# Patient Record
Sex: Male | Born: 1965 | Race: Black or African American | Hispanic: No | Marital: Married | Smoking: Never smoker
Health system: Southern US, Community
[De-identification: ages and names within clinical notes are randomized; demographics above are authoritative.]

## PROBLEM LIST (undated history)

## (undated) DIAGNOSIS — M722 Plantar fascial fibromatosis: Secondary | ICD-10-CM

## (undated) DIAGNOSIS — N200 Calculus of kidney: Secondary | ICD-10-CM

## (undated) HISTORY — DX: Calculus of kidney: N20.0

## (undated) HISTORY — PX: NO PAST SURGERIES: SHX2092

---

## 1999-09-18 ENCOUNTER — Emergency Department (HOSPITAL_COMMUNITY): Admission: EM | Admit: 1999-09-18 | Discharge: 1999-09-18 | Payer: Self-pay | Admitting: Emergency Medicine

## 2008-12-23 ENCOUNTER — Emergency Department (HOSPITAL_COMMUNITY): Admission: EM | Admit: 2008-12-23 | Discharge: 2008-12-24 | Payer: Self-pay | Admitting: Emergency Medicine

## 2008-12-26 ENCOUNTER — Emergency Department (HOSPITAL_COMMUNITY): Admission: EM | Admit: 2008-12-26 | Discharge: 2008-12-26 | Payer: Self-pay | Admitting: Emergency Medicine

## 2011-02-17 LAB — URINALYSIS, ROUTINE W REFLEX MICROSCOPIC
Bilirubin Urine: NEGATIVE
Bilirubin Urine: NEGATIVE
Glucose, UA: NEGATIVE mg/dL
Ketones, ur: NEGATIVE mg/dL
Ketones, ur: NEGATIVE mg/dL
Leukocytes, UA: NEGATIVE
Nitrite: NEGATIVE
Nitrite: NEGATIVE
Protein, ur: 100 mg/dL — AB
Specific Gravity, Urine: 1.028 (ref 1.005–1.030)
Urobilinogen, UA: 0.2 mg/dL (ref 0.0–1.0)
pH: 6 (ref 5.0–8.0)
pH: 6 (ref 5.0–8.0)

## 2011-02-17 LAB — POCT I-STAT, CHEM 8
BUN: 12 mg/dL (ref 6–23)
BUN: 16 mg/dL (ref 6–23)
Calcium, Ion: 1.06 mmol/L — ABNORMAL LOW (ref 1.12–1.32)
Calcium, Ion: 1.15 mmol/L (ref 1.12–1.32)
Chloride: 104 mEq/L (ref 96–112)
Chloride: 105 mEq/L (ref 96–112)
Creatinine, Ser: 1.2 mg/dL (ref 0.4–1.5)
Glucose, Bld: 129 mg/dL — ABNORMAL HIGH (ref 70–99)
HCT: 41 % (ref 39.0–52.0)
Potassium: 4.1 mEq/L (ref 3.5–5.1)
Sodium: 142 mEq/L (ref 135–145)

## 2011-02-17 LAB — CBC
HCT: 48.7 % (ref 39.0–52.0)
MCHC: 33.7 g/dL (ref 30.0–36.0)
Platelets: 169 10*3/uL (ref 150–400)
RDW: 13.8 % (ref 11.5–15.5)

## 2011-02-17 LAB — URINE MICROSCOPIC-ADD ON

## 2011-02-17 LAB — DIFFERENTIAL
Basophils Absolute: 0 10*3/uL (ref 0.0–0.1)
Basophils Relative: 0 % (ref 0–1)
Eosinophils Relative: 0 % (ref 0–5)
Lymphocytes Relative: 2 % — ABNORMAL LOW (ref 12–46)

## 2011-08-21 ENCOUNTER — Encounter: Payer: Self-pay | Admitting: Cardiovascular Disease

## 2011-08-24 ENCOUNTER — Encounter: Payer: Self-pay | Admitting: Cardiovascular Disease

## 2011-08-24 ENCOUNTER — Ambulatory Visit (INDEPENDENT_AMBULATORY_CARE_PROVIDER_SITE_OTHER): Payer: BC Managed Care – PPO | Admitting: Cardiovascular Disease

## 2011-08-24 DIAGNOSIS — R079 Chest pain, unspecified: Secondary | ICD-10-CM | POA: Insufficient documentation

## 2011-08-24 NOTE — Progress Notes (Signed)
History of Present Illness:45 yo male with no significant past medical history who is referred today for evaluation of chest pain, SOB and dizziness. He has no prior history of DM, HTN or HLD. He is active and plays basketball and softball often. He tells me that had been doing well until the last month. He has been under tremendous stress over the last month. He describes sharp chest pains in the middle of his chest, lasted for a few seconds. There was associated SOB. Usually occurred while driving or while at rest. He did have some soreness in his right chest with exercise. He played basketball last week and felt weak when he was done. He had been taking Temazepam at night and this helped his sleep but did make him feel bad during the day. He is not taking this medication  anymore.   His father has a history of CAD s/p CABG in his 88s.   Past Medical History  Diagnosis Date  . Nephrolithiasis     No past surgical history on file.  No current outpatient prescriptions on file.    No Known Allergies  History   Social History  . Marital Status: Single    Spouse Name: [facility]    Number of Children: 1  . Years of Education: N/A   Occupational History  .  [employer]   Social History Main Topics  . Smoking status: Never Smoker   . Smokeless tobacco: Not on file  . Alcohol Use: No  . Drug Use: No  . Sexually Active: Not on file   Other Topics Concern  . Not on file   Social History Narrative  . No narrative on file    Family History  Problem Relation Age of Onset  . Diabetes Father   . Heart disease Father     Review of Systems:  As stated in the HPI and otherwise negative.   BP 114/75  Pulse 60  Resp 18  Ht 6\' 2"  (1.88 m)  Wt 237 lb (107.502 kg)  BMI 30.43 kg/m2  Physical Examination: General: Well developed, well nourished, NAD HEENT: OP clear, mucus membranes moist SKIN: warm, dry. No rashes. Neuro: No focal deficits Musculoskeletal: Muscle  strength 5/5 all ext Psychiatric: Mood and affect normal Neck: No JVD, no carotid bruits, no thyromegaly, no lymphadenopathy. Lungs:Clear bilaterally, no wheezes, rhonci, crackles Cardiovascular: Regular rate and rhythm. No murmurs, gallops or rubs. Abdomen:Soft. Bowel sounds present. Non-tender.  Extremities: No lower extremity edema. Pulses are 2 + in the bilateral DP/PT.  ION:GEXBM bradycardia, rate 52 bpm. Sinus arrythmia

## 2011-08-24 NOTE — Patient Instructions (Signed)
Your physician recommends that you schedule a follow-up appointment as needed.   Your physician has requested that you have a stress echocardiogram. For further information please visit [email or web address]. Please follow instruction sheet as given.   

## 2011-08-24 NOTE — Assessment & Plan Note (Addendum)
Substernal chest pain with mostly atypical features although he has had less exercise tolerance lately and some right chest wall pain with exercise. He does have a family history of CAD but no personal risk factors. Will arrange exercise stress echo and call pt with results.

## 2011-09-04 ENCOUNTER — Ambulatory Visit (HOSPITAL_COMMUNITY): Payer: BC Managed Care – PPO | Attending: Cardiovascular Disease | Admitting: Radiology

## 2011-09-04 ENCOUNTER — Ambulatory Visit (HOSPITAL_COMMUNITY): Payer: BC Managed Care – PPO | Admitting: Radiology

## 2011-09-04 DIAGNOSIS — R0989 Other specified symptoms and signs involving the circulatory and respiratory systems: Secondary | ICD-10-CM | POA: Insufficient documentation

## 2011-09-04 DIAGNOSIS — R0609 Other forms of dyspnea: Secondary | ICD-10-CM | POA: Insufficient documentation

## 2011-09-04 DIAGNOSIS — R5381 Other malaise: Secondary | ICD-10-CM | POA: Insufficient documentation

## 2011-09-04 DIAGNOSIS — R072 Precordial pain: Secondary | ICD-10-CM | POA: Insufficient documentation

## 2011-09-07 ENCOUNTER — Telehealth: Payer: Self-pay | Admitting: Cardiology

## 2011-09-07 NOTE — Telephone Encounter (Signed)
Spoke with pt and gave him results of stress echo.

## 2011-09-07 NOTE — Telephone Encounter (Signed)
Pt rtn call 

## 2011-10-02 ENCOUNTER — Encounter: Payer: Self-pay | Admitting: Cardiology

## 2011-12-25 ENCOUNTER — Emergency Department (HOSPITAL_COMMUNITY): Payer: BC Managed Care – PPO

## 2011-12-25 ENCOUNTER — Encounter (HOSPITAL_COMMUNITY): Payer: Self-pay

## 2011-12-25 ENCOUNTER — Emergency Department (HOSPITAL_COMMUNITY)
Admission: EM | Admit: 2011-12-25 | Discharge: 2011-12-25 | Disposition: A | Discharge status: Home / Self Care | Payer: BC Managed Care – PPO | Attending: Emergency Medicine | Admitting: Emergency Medicine

## 2011-12-25 ENCOUNTER — Other Ambulatory Visit: Payer: Self-pay

## 2011-12-25 DIAGNOSIS — F419 Anxiety disorder, unspecified: Secondary | ICD-10-CM

## 2011-12-25 DIAGNOSIS — M62838 Other muscle spasm: Secondary | ICD-10-CM

## 2011-12-25 DIAGNOSIS — Z87442 Personal history of urinary calculi: Secondary | ICD-10-CM | POA: Insufficient documentation

## 2011-12-25 DIAGNOSIS — R079 Chest pain, unspecified: Secondary | ICD-10-CM | POA: Insufficient documentation

## 2011-12-25 DIAGNOSIS — R0602 Shortness of breath: Secondary | ICD-10-CM | POA: Insufficient documentation

## 2011-12-25 DIAGNOSIS — R109 Unspecified abdominal pain: Secondary | ICD-10-CM | POA: Insufficient documentation

## 2011-12-25 DIAGNOSIS — R42 Dizziness and giddiness: Secondary | ICD-10-CM | POA: Insufficient documentation

## 2011-12-25 LAB — CBC
HCT: 41.3 % (ref 39.0–52.0)
Hemoglobin: 14 g/dL (ref 13.0–17.0)
MCHC: 33.9 g/dL (ref 30.0–36.0)
RDW: 13.3 % (ref 11.5–15.5)
WBC: 11.3 10*3/uL — ABNORMAL HIGH (ref 4.0–10.5)

## 2011-12-25 LAB — POCT I-STAT TROPONIN I

## 2011-12-25 LAB — POCT I-STAT, CHEM 8
Chloride: 104 mEq/L (ref 96–112)
Creatinine, Ser: 1 mg/dL (ref 0.50–1.35)
HCT: 43 % (ref 39.0–52.0)
Hemoglobin: 14.6 g/dL (ref 13.0–17.0)
Potassium: 4 mEq/L (ref 3.5–5.1)
Sodium: 142 mEq/L (ref 135–145)

## 2011-12-25 LAB — D-DIMER, QUANTITATIVE: D-Dimer, Quant: 0.28 ug/mL-FEU (ref 0.00–0.48)

## 2011-12-25 MED ORDER — CYCLOBENZAPRINE HCL 10 MG PO TABS: 10.0000 mg | ORAL_TABLET | Freq: Two times a day (BID) | ORAL | Status: AC | PRN

## 2011-12-25 MED ORDER — IBUPROFEN 800 MG PO TABS: 800.0000 mg | ORAL_TABLET | Freq: Three times a day (TID) | ORAL | Status: AC

## 2011-12-25 NOTE — Discharge Instructions (Signed)
Mitchell Barry EKG and lab work today did not show any problems with your heart. The lab work also showed that you do not have a blood clot in her lungs. Shortness of breath that comes when you're anxious to be accelerated breathing due to anxiety. The pain you're having in the right believe is musculoskeletal pain. He can use ice on this. It also use ibuprofen 800 every 6 hours x24 hours. Use the muscle relaxer but do not drive with this. Followup with your PCP next week. He turned to the ER for severe chest pain shortness of breath or any other concerns   Anxiety and Panic Attacks Your caregiver has informed you that you are having an anxiety or panic attack. There may be many forms of this. Most of the time these attacks come suddenly and without warning. They come at any time of day, including periods of sleep, and at any time of life. They may be strong and unexplained. Although panic attacks are very scary, they are physically harmless. Sometimes the cause of your anxiety is not known. Anxiety is a protective mechanism of the body in its fight or flight mechanism. Most of these perceived danger situations are actually nonphysical situations (such as anxiety over losing a job). CAUSES  The causes of an anxiety or panic attack are many. Panic attacks may occur in otherwise healthy people given a certain set of circumstances. There may be a genetic cause for panic attacks. Some medications may also have anxiety as a side effect. SYMPTOMS  Some of the most common feelings are:  Intense terror.   Dizziness, feeling faint.   Hot and cold flashes.   Fear of going crazy.   Feelings that nothing is real.   Sweating.   Shaking.   Chest pain or a fast heartbeat (palpitations).   Smothering, choking sensations.   Feelings of impending doom and that death is near.   Tingling of extremities, this may be from over-breathing.   Altered reality (derealization).   Being detached from yourself  (depersonalization).  Several symptoms can be present to make up anxiety or panic attacks. DIAGNOSIS  The evaluation by your caregiver will depend on the type of symptoms you are experiencing. The diagnosis of anxiety or panic attack is made when no physical illness can be determined to be a cause of the symptoms. TREATMENT  Treatment to prevent anxiety and panic attacks may include:  Avoidance of circumstances that cause anxiety.   Reassurance and relaxation.   Regular exercise.   Relaxation therapies, such as yoga.   Psychotherapy with a psychiatrist or therapist.   Avoidance of caffeine, alcohol and illegal drugs.   Prescribed medication.  SEEK IMMEDIATE MEDICAL CARE IF:   You experience panic attack symptoms that are different than your usual symptoms.   You have any worsening or concerning symptoms.  Document Released: 10/19/2005 Document Revised: 07/01/2011 Document Reviewed: 02/20/2010 [facility] Patient Information 2012 [place], [place].Anxiety and Panic Attacks Your caregiver has informed you that you are having an anxiety or panic attack. There may be many forms of this. Most of the time these attacks come suddenly and without warning. They come at any time of day, including periods of sleep, and at any time of life. They may be strong and unexplained. Although panic attacks are very scary, they are physically harmless. Sometimes the cause of your anxiety is not known. Anxiety is a protective mechanism of the body in its fight or flight mechanism. Most of these perceived danger  situations are actually nonphysical situations (such as anxiety over losing a job). CAUSES  The causes of an anxiety or panic attack are many. Panic attacks may occur in otherwise healthy people given a certain set of circumstances. There may be a genetic cause for panic attacks. Some medications may also have anxiety as a side effect. SYMPTOMS  Some of the most common feelings are:  Intense terror.    Dizziness, feeling faint.   Hot and cold flashes.   Fear of going crazy.   Feelings that nothing is real.   Sweating.   Shaking.   Chest pain or a fast heartbeat (palpitations).   Smothering, choking sensations.   Feelings of impending doom and that death is near.   Tingling of extremities, this may be from over-breathing.   Altered reality (derealization).   Being detached from yourself (depersonalization).  Several symptoms can be present to make up anxiety or panic attacks. DIAGNOSIS  The evaluation by your caregiver will depend on the type of symptoms you are experiencing. The diagnosis of anxiety or panic attack is made when no physical illness can be determined to be a cause of the symptoms. TREATMENT  Treatment to prevent anxiety and panic attacks may include:  Avoidance of circumstances that cause anxiety.   Reassurance and relaxation.   Regular exercise.   Relaxation therapies, such as yoga.   Psychotherapy with a psychiatrist or therapist.   Avoidance of caffeine, alcohol and illegal drugs.   Prescribed medication.  SEEK IMMEDIATE MEDICAL CARE IF:   You experience panic attack symptoms that are different than your usual symptoms.   You have any worsening or concerning symptoms.  Document Released: 10/19/2005 Document Revised: 07/01/2011 Document Reviewed: 02/20/2010 [facility] Patient Information 2012 [place], [place].
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
## 2011-12-25 NOTE — ED Notes (Signed)
Pt ambulatory at discharge, accompanied by family.  Currently rates pain at 2/10.

## 2011-12-26 NOTE — ED Provider Notes (Signed)
Medical screening examination/treatment/procedure(s) were performed by non-physician practitioner and as supervising physician I was immediately available for consultation/collaboration. Devoria Albe, MD, FACEP   Ward Givens, MD 12/26/11 1332

## 2013-07-27 ENCOUNTER — Encounter: Payer: Self-pay | Admitting: Cardiovascular Disease

## 2014-06-07 ENCOUNTER — Ambulatory Visit (INDEPENDENT_AMBULATORY_CARE_PROVIDER_SITE_OTHER): Payer: Self-pay | Admitting: Family Medicine

## 2014-06-07 ENCOUNTER — Ambulatory Visit (INDEPENDENT_AMBULATORY_CARE_PROVIDER_SITE_OTHER): Payer: Self-pay

## 2014-06-07 VITALS — BP 120/80 | HR 59 | Temp 97.8°F | Resp 16 | Ht 72.25 in | Wt 239.0 lb

## 2014-06-07 DIAGNOSIS — L259 Unspecified contact dermatitis, unspecified cause: Secondary | ICD-10-CM

## 2014-06-07 DIAGNOSIS — M79641 Pain in right hand: Secondary | ICD-10-CM

## 2014-06-07 DIAGNOSIS — M25531 Pain in right wrist: Secondary | ICD-10-CM

## 2014-06-07 DIAGNOSIS — M79609 Pain in unspecified limb: Secondary | ICD-10-CM

## 2014-06-07 DIAGNOSIS — T148XXA Other injury of unspecified body region, initial encounter: Secondary | ICD-10-CM

## 2014-06-07 DIAGNOSIS — M25539 Pain in unspecified wrist: Secondary | ICD-10-CM

## 2014-06-07 DIAGNOSIS — L309 Dermatitis, unspecified: Secondary | ICD-10-CM

## 2014-06-07 MED ORDER — HYDROCODONE-ACETAMINOPHEN 5-325 MG PO TABS: 1.0000 | ORAL_TABLET | Freq: Four times a day (QID) | ORAL | Status: DC | PRN

## 2014-06-07 MED ORDER — TRIAMCINOLONE ACETONIDE 0.1 % EX CREA: 1.0000 "application " | TOPICAL_CREAM | Freq: Two times a day (BID) | CUTANEOUS | Status: DC

## 2014-06-07 NOTE — Progress Notes (Signed)
 Chief Complaint:  Chief Complaint  Patient presents with  . Rash    X 3 months, both arms and neck  . Hand Pain    Right, X last night, jammed playing basketball    HPI: Mitchell Barry is a 48 y.o. 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 He has had a broken the same  hand in middle school. IT is throbbing and painful with grasping. Has tried otc NSAIDs for this.  No w/n/t  Past Medical History  Diagnosis Date  . Nephrolithiasis    History reviewed. No pertinent past surgical history. History   Social History  . Marital Status: Married    Spouse Name: Marrion Accomando    Number of Children: 1  . Years of Education: N/A   Occupational History  . Behavioral intervention [employer]   Social History Main Topics  . Smoking status: Never Smoker   . Smokeless tobacco: None  . Alcohol Use: No  . Drug Use: No  . Sexual Activity: None   Other Topics Concern  . None   Social History Narrative  . None   Family History  Problem Relation Age of Onset  . Diabetes Father   . Heart disease Father    No Known Allergies Prior to Admission medications   Medication Sig Start Date End Date Taking? Authorizing Provider  fish oil-omega-3 fatty acids 1000 MG capsule Take 2 g by mouth daily.    Historical Provider, MD     ROS: The patient denies fevers, chills, night sweats, unintentional weight loss, chest pain, palpitations, wheezing, dyspnea on exertion, nausea, vomiting, abdominal pain, dysuria, hematuria, melena, numbness, weakness, or tingling.   All other systems have been reviewed and were otherwise negative with the exception of those mentioned in the HPI and as above.    PHYSICAL EXAM: Filed Vitals:   06/07/14 1820  BP: 120/80  Pulse: 59  Temp: 97.8 F (36.6 C)  Resp: 16    Filed Vitals:   06/07/14 1820  Height: 6' 0.25" (1.835 m)  Weight: 239 lb (108.41 kg)   Body mass index is 32.2 kg/(m^2).  General: Alert, no acute distress HEENT:  Normocephalic, atraumatic, oropharynx patent. EOMI, PERRLA Cardiovascular:  Regular rate and rhythm, no rubs murmurs or gallops.  No Carotid bruits, radial pulse intact. No pedal edema.  Respiratory: Clear to auscultation bilaterally.  No wheezes, rales, or rhonchi.  No cyanosis, no use of accessory musculature GI: No organomegaly, abdomen is soft and non-tender, positive bowel sounds.  No masses. Skin: + eczematous rash Neurologic: Facial musculature symmetric. Psychiatric: Patient is appropriate throughout our interaction. Lymphatic: No cervical lymphadenopathy Musculoskeletal: Gait intact. Right hand-swelling, + bruising, decrease ROm due to swelling primarily with grip, minimally tender with palpation, + radial pulses, sensationintact 5/5 strength   LABS:    EKG/XRAY:   Primary read interpreted by Dr. Conley Rolls at [facility]. THere is some erosion of 1st metacarpal please comment if that is a fracture or just erosion   ASSESSMENT/PLAN: Encounter Diagnoses  Name Primary?  . Right wrist pain   . Hand pain, right   . Eczema   . Sprain and strain Yes   No fractures of wrist, d/w patient official xray results IMPRESSION:  Lucency in the head of the first  metacarpal. This may represent a  prominent nutrient foramen. If there is point tenderness, then a  fracture would be suspected.  Electronically Signed  By: Esperanza Heiraymond Rubner M.D.  On: 06/07/2014 19:50  Will ice and continue with ibuprofen Rx Norco and also kenalog  F/.u prn  Gross sideeffects, risk and benefits, and alternatives of medications d/w patient. Patient is aware that all medications have potential sideeffects and we are unable to predict every sideeffect or drug-drug interaction that may occur.  Hamilton CapriLE,  PHUONG, DO 06/12/2014 7:39 AM

## 2015-04-08 ENCOUNTER — Ambulatory Visit (INDEPENDENT_AMBULATORY_CARE_PROVIDER_SITE_OTHER): Payer: Self-pay

## 2015-04-08 ENCOUNTER — Ambulatory Visit (INDEPENDENT_AMBULATORY_CARE_PROVIDER_SITE_OTHER): Payer: Self-pay | Admitting: Physician Assistant

## 2015-04-08 VITALS — BP 112/72 | HR 59 | Temp 98.3°F | Resp 18 | Ht 74.0 in | Wt 237.0 lb

## 2015-04-08 DIAGNOSIS — K921 Melena: Secondary | ICD-10-CM

## 2015-04-08 DIAGNOSIS — R109 Unspecified abdominal pain: Secondary | ICD-10-CM

## 2015-04-08 DIAGNOSIS — Z87448 Personal history of other diseases of urinary system: Secondary | ICD-10-CM

## 2015-04-08 LAB — POCT CBC
GRANULOCYTE PERCENT: 64.1 % (ref 37–80)
HEMATOCRIT: 45.9 % (ref 43.5–53.7)
HEMOGLOBIN: 14.8 g/dL (ref 14.1–18.1)
LYMPH, POC: 2.4 (ref 0.6–3.4)
MCH, POC: 28.1 pg (ref 27–31.2)
MCHC: 32.2 g/dL (ref 31.8–35.4)
MCV: 87.2 fL (ref 80–97)
MID (cbc): 0.3 (ref 0–0.9)
MPV: 7.5 fL (ref 0–99.8)
PLATELET COUNT, POC: 216 10*3/uL (ref 142–424)
POC Granulocyte: 4.9 (ref 2–6.9)
POC LYMPH PERCENT: 31.7 %L (ref 10–50)
POC MID %: 4.2 % (ref 0–12)
RBC: 5.26 M/uL (ref 4.69–6.13)
RDW, POC: 14.4 %
WBC: 7.7 10*3/uL (ref 4.6–10.2)

## 2015-04-08 LAB — POCT UA - MICROSCOPIC ONLY
BACTERIA, U MICROSCOPIC: NEGATIVE
CASTS, UR, LPF, POC: NEGATIVE
CRYSTALS, UR, HPF, POC: NEGATIVE
Mucus, UA: NEGATIVE
Yeast, UA: NEGATIVE

## 2015-04-08 LAB — POCT URINALYSIS DIPSTICK
BILIRUBIN UA: NEGATIVE
Blood, UA: NEGATIVE
Glucose, UA: NEGATIVE
KETONES UA: NEGATIVE
Leukocytes, UA: NEGATIVE
Nitrite, UA: NEGATIVE
PH UA: 6
Protein, UA: NEGATIVE
Spec Grav, UA: 1.025
Urobilinogen, UA: 0.2

## 2015-04-08 MED ORDER — CYCLOBENZAPRINE HCL 10 MG PO TABS: 10.0000 mg | ORAL_TABLET | Freq: Three times a day (TID) | ORAL | Status: DC | PRN

## 2015-04-08 MED ORDER — MELOXICAM 15 MG PO TABS: 15.0000 mg | ORAL_TABLET | Freq: Every day | ORAL | Status: DC

## 2015-04-08 NOTE — Progress Notes (Signed)
[facility] [address], [place] [address] [phone]  Date:  04/08/2015   Name:  Mitchell RoachJames W Barry   DOB:  07/03/1966   MRN:  [id number]  PCP:  Tonye PearsonOLITTLE, ROBERT P, MD    History of Present Illness:  Mitchell RoachJames W Barry is a 49 y.o. male patient who [facility] for left sided back pain.  This has been for about 3 weeks.  He states it feels tight.  When he steps down he feels more pain.  He rates the pain 2-3/10.  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  He has continued to play basketball through it.  He is drinking plenty of water.  He denies dysuria, weakened stream, frequency, hematuria, or fever.  He is concerned due to kidney stone that occurred years ago.  This passed in hospital without procedure.       Patient Active Problem List   Diagnosis Date Noted  . Chest pain 08/24/2011    Past Medical History  Diagnosis Date  . Nephrolithiasis     History reviewed. No pertinent past surgical history.  History  Substance Use Topics  . Smoking status: Never Smoker   . Smokeless tobacco: Not on file  . Alcohol Use: No    Family History  Problem Relation Age of Onset  . Diabetes Father   . Heart disease Father     No Known Allergies  Medication list has been reviewed and updated.  Current Outpatient Prescriptions on File Prior to Visit  Medication Sig Dispense Refill  . triamcinolone cream (KENALOG) 0.1 % Apply 1 application topically 2 (two) times daily. 60 g 1  . fish oil-omega-3 fatty acids 1000 MG capsule Take 2 g by mouth daily.    Marland Kitchen. HYDROcodone-acetaminophen (NORCO) 5-325 MG per tablet Take 1 tablet by mouth every 6 (six) hours as needed for moderate pain. Take with stool softener (Patient not taking: Reported on 04/08/2015) 20 tablet 0   No current facility-administered medications on file prior to visit.    ROS   Physical Examination: BP 112/72 mmHg   Pulse 59  Temp(Src) 98.3 F (36.8 C) (Oral)  Resp 18  Ht 6\' 2"  (1.88 m)  Wt 237 lb (107.502 kg)  BMI 30.42 kg/m2  SpO2 99% Ideal Body Weight: Weight in (lb) to have BMI = 25: 194.3  Physical Exam  Constitutional: He is oriented to person, place, and time. He appears well-developed and well-nourished. No distress.  Eyes: Pupils are equal, round, and reactive to light.  Neck: Normal range of motion. Neck supple.  Cardiovascular: Normal rate, regular rhythm and normal heart sounds.   Pulmonary/Chest: Effort normal and breath sounds normal. No respiratory distress. He has no wheezes.  Abdominal: Soft. Bowel sounds are normal. There is no CVA tenderness, no tenderness at McBurney's point and negative Murphy's sign.  When he lays, there is tenderness along his left side of back upon palpation when he is laying supine, but not directly to the abdomen.     Musculoskeletal: Normal range of motion.  No spinous tenderness.  No pain upon palpation to back musculature when standing.  He points to pain incited with forward flexion at his left side.  Normal lateral deviation which sparks some pain when deviating to right side.  Normal with left side deviation.  Normal horizontal torso rotation.    Neurological: He is alert and  oriented to person, place, and time.  Skin: He is not diaphoretic.  Psychiatric: He has a normal mood and affect. His speech is normal and behavior is normal.    Results for orders placed or performed in visit on 04/08/15  POCT CBC  Result Value Ref Range   WBC 7.7 4.6 - 10.2 K/uL   Lymph, poc 2.4 0.6 - 3.4   POC LYMPH PERCENT 31.7 10 - 50 %L   MID (cbc) 0.3 0 - 0.9   POC MID % 4.2 0 - 12 %M   POC Granulocyte 4.9 2 - 6.9   Granulocyte percent 64.1 37 - 80 %G   RBC 5.26 4.69 - 6.13 M/uL   Hemoglobin 14.8 14.1 - 18.1 g/dL   HCT, POC 16.1 09.6 - 53.7 %   MCV 87.2 80 - 97 fL   MCH, POC 28.1 27 - 31.2 pg   MCHC 32.2 31.8 - 35.4 g/dL   RDW, POC 04.5 %   Platelet Count,  POC 216 142 - 424 K/uL   MPV 7.5 0 - 99.8 fL  POCT UA - Microscopic Only  Result Value Ref Range   WBC, Ur, HPF, POC 0-1    RBC, urine, microscopic 0-1    Bacteria, U Microscopic neg    Mucus, UA neg    Epithelial cells, urine per micros 0-1    Crystals, Ur, HPF, POC neg    Casts, Ur, LPF, POC neg    Yeast, UA neg   POCT urinalysis dipstick  Result Value Ref Range   Color, UA yellow    Clarity, UA clear    Glucose, UA neg    Bilirubin, UA neg    Ketones, UA neg    Spec Grav, UA 1.025    Blood, UA neg    pH, UA 6.0    Protein, UA neg    Urobilinogen, UA 0.2    Nitrite, UA neg    Leukocytes, UA Negative    [facility] reading (PRIMARY) by  Dr. Cleta Alberts: Calcifications in the bladder consistent with phleboliths.    Assessment and Plan: 49 year old male is here today for chief complaint of left sided flank pain.  I am more suspicious that this is deep muscle spasming or injury.  UA is not significant.  To be more confident, I have recommended a CT to rule out kidney stone, or abdominal etiology.  He declines at this time, and would like to proceed with treatment of musculature.  I have warned him of alarming symptoms that warrant immediate return.  Advised ice, light stretches, and rest both in handout and verbally.  I have prescribed a once day mobic and flexeril to combat muscle pain.  He will do this for 7 days and contact if symptoms do not improve where CT will be scheduled.  1. Flank pain - POCT CBC - POCT UA - Microscopic Only - POCT urinalysis dipstick - DG Abd 1 View - meloxicam (MOBIC) 15 MG tablet; Take 1 tablet (15 mg total) by mouth daily.  Dispense: 30 tablet; Refill: 1 - cyclobenzaprine (FLEXERIL) 10 MG tablet; Take 1 tablet (10 mg total) by mouth 3 (three) times daily as needed for muscle spasms.  Dispense: 30 tablet; Refill: 0 - IFOBT POC (occult bld, rslt in office)  2. Hx of kidney disease  3. Blood in stool - IFOBT POC (occult bld, rslt in office)   Trena Platt, PA-C [facility] 04/08/2015 11:22  AM     

## 2015-04-08 NOTE — Patient Instructions (Signed)
Please be aware of increased abdominal or flank pain, fever, nausea, sob, or blood in the urine.  If these occur, please return to our clinic immediately.     Low Back Strain with Rehab A strain is an injury in which a tendon or muscle is torn. The muscles and tendons of the lower back are vulnerable to strains. However, these muscles and tendons are very strong and require a great force to be injured. Strains are classified into three categories. Grade 1 strains cause pain, but the tendon is not lengthened. Grade 2 strains include a lengthened ligament, due to the ligament being stretched or partially ruptured. With grade 2 strains there is still function, although the function may be decreased. Grade 3 strains involve a complete tear of the tendon or muscle, and function is usually impaired. SYMPTOMS   Pain in the lower back.  Pain that affects one side more than the other.  Pain that gets worse with movement and may be felt in the hip, buttocks, or back of the thigh.  Muscle spasms of the muscles in the back.  Swelling along the muscles of the back.  Loss of strength of the back muscles.  Crackling sound (crepitation) when the muscles are touched. CAUSES  Lower back strains occur when a force is placed on the muscles or tendons that is greater than they can handle. Common causes of injury include:  Prolonged overuse of the muscle-tendon units in the lower back, usually from incorrect posture.  A single violent injury or force applied to the back. RISK INCREASES WITH:  Sports that involve twisting forces on the spine or a lot of bending at the waist (football, rugby, weightlifting, bowling, golf, tennis, speed skating, racquetball, swimming, running, gymnastics, diving).  Poor strength and flexibility.  Failure to warm up properly before activity.  Family history of lower back pain or disk disorders.  Previous back injury or surgery (especially fusion).  Poor posture with  lifting, especially heavy objects.  Prolonged sitting, especially with poor posture. PREVENTION   Learn and use proper posture when sitting or lifting (maintain proper posture when sitting, lift using the knees and legs, not at the waist).  Warm up and stretch properly before activity.  Allow for adequate recovery between workouts.  Maintain physical fitness:  Strength, flexibility, and endurance.  Cardiovascular fitness. PROGNOSIS  If treated properly, lower back strains usually heal within 6 weeks. RELATED COMPLICATIONS   Recurring symptoms, resulting in a chronic problem.  Chronic inflammation, scarring, and partial muscle-tendon tear.  Delayed healing or resolution of symptoms.  Prolonged disability. TREATMENT  Treatment first involves the use of ice and medicine, to reduce pain and inflammation. The use of strengthening and stretching exercises may help reduce pain with activity. These exercises may be performed at home or with a therapist. Severe injuries may require referral to a therapist for further evaluation and treatment, such as ultrasound. Your caregiver may advise that you wear a back brace or corset, to help reduce pain and discomfort. Often, prolonged bed rest results in greater harm then benefit. Corticosteroid injections may be recommended. However, these should be reserved for the most serious cases. It is important to avoid using your back when lifting objects. At night, sleep on your back on a firm mattress with a pillow placed under your knees. If non-surgical treatment is unsuccessful, surgery may be needed.  MEDICATION   If pain medicine is needed, nonsteroidal anti-inflammatory medicines (aspirin and ibuprofen), or other minor pain relievers (acetaminophen),  are often advised.  Do not take pain medicine for 7 days before surgery.  Prescription pain relievers may be given, if your caregiver thinks they are needed. Use only as directed and only as much as  you need.  Ointments applied to the skin may be helpful.  Corticosteroid injections may be given by your caregiver. These injections should be reserved for the most serious cases, because they may only be given a certain number of times. HEAT AND COLD  Cold treatment (icing) should be applied for 10 to 15 minutes every 2 to 3 hours for inflammation and pain, and immediately after activity that aggravates your symptoms. Use ice packs or an ice massage.  Heat treatment may be used before performing stretching and strengthening activities prescribed by your caregiver, physical therapist, or athletic trainer. Use a heat pack or a warm water soak. SEEK MEDICAL CARE IF:   Symptoms get worse or do not improve in 2 to 4 weeks, despite treatment.  You develop numbness, weakness, or loss of bowel or bladder function.  New, unexplained symptoms develop. (Drugs used in treatment may produce side effects.) EXERCISES  RANGE OF MOTION (ROM) AND STRETCHING EXERCISES - Low Back Strain Most people with lower back pain will find that their symptoms get worse with excessive bending forward (flexion) or arching at the lower back (extension). The exercises which will help resolve your symptoms will focus on the opposite motion.  Your physician, physical therapist or athletic trainer will help you determine which exercises will be most helpful to resolve your lower back pain. Do not complete any exercises without first consulting with your caregiver. Discontinue any exercises which make your symptoms worse until you speak to your caregiver.  If you have pain, numbness or tingling which travels down into your buttocks, leg or foot, the goal of the therapy is for these symptoms to move closer to your back and eventually resolve. Sometimes, these leg symptoms will get better, but your lower back pain may worsen. This is typically an indication of progress in your rehabilitation. Be very alert to any changes in your  symptoms and the activities in which you participated in the 24 hours prior to the change. Sharing this information with your caregiver will allow him/her to most efficiently treat your condition.  These exercises may help you when beginning to rehabilitate your injury. Your symptoms may resolve with or without further involvement from your physician, physical therapist or athletic trainer. While completing these exercises, remember:  Restoring tissue flexibility helps normal motion to return to the joints. This allows healthier, less painful movement and activity.  An effective stretch should be held for at least 30 seconds.  A stretch should never be painful. You should only feel a gentle lengthening or release in the stretched tissue. FLEXION RANGE OF MOTION AND STRETCHING EXERCISES: STRETCH - Flexion, Single Knee to Chest   Lie on a firm bed or floor with both legs extended in front of you.  Keeping one leg in contact with the floor, bring your opposite knee to your chest. Hold your leg in place by either grabbing behind your thigh or at your knee.  Pull until you feel a gentle stretch in your lower back. Hold __________ seconds.  Slowly release your grasp and repeat the exercise with the opposite side. Repeat __________ times. Complete this exercise __________ times per day.  STRETCH - Flexion, Double Knee to Chest   Lie on a firm bed or floor with both legs extended  in front of you.  Keeping one leg in contact with the floor, bring your opposite knee to your chest.  Tense your stomach muscles to support your back and then lift your other knee to your chest. Hold your legs in place by either grabbing behind your thighs or at your knees.  Pull both knees toward your chest until you feel a gentle stretch in your lower back. Hold __________ seconds.  Tense your stomach muscles and slowly return one leg at a time to the floor. Repeat __________ times. Complete this exercise __________  times per day.  STRETCH - Low Trunk Rotation  Lie on a firm bed or floor. Keeping your legs in front of you, bend your knees so they are both pointed toward the ceiling and your feet are flat on the floor.  Extend your arms out to the side. This will stabilize your upper body by keeping your shoulders in contact with the floor.  Gently and slowly drop both knees together to one side until you feel a gentle stretch in your lower back. Hold for __________ seconds.  Tense your stomach muscles to support your lower back as you bring your knees back to the starting position. Repeat the exercise to the other side. Repeat __________ times. Complete this exercise __________ times per day  EXTENSION RANGE OF MOTION AND FLEXIBILITY EXERCISES: STRETCH - Extension, Prone on Elbows   Lie on your stomach on the floor, a bed will be too soft. Place your palms about shoulder width apart and at the height of your head.  Place your elbows under your shoulders. If this is too painful, stack pillows under your chest.  Allow your body to relax so that your hips drop lower and make contact more completely with the floor.  Hold this position for __________ seconds.  Slowly return to lying flat on the floor. Repeat __________ times. Complete this exercise __________ times per day.  RANGE OF MOTION - Extension, Prone Press Ups  Lie on your stomach on the floor, a bed will be too soft. Place your palms about shoulder width apart and at the height of your head.  Keeping your back as relaxed as possible, slowly straighten your elbows while keeping your hips on the floor. You may adjust the placement of your hands to maximize your comfort. As you gain motion, your hands will come more underneath your shoulders.  Hold this position __________ seconds.  Slowly return to lying flat on the floor. Repeat __________ times. Complete this exercise __________ times per day.  RANGE OF MOTION- Quadruped, Neutral Spine    Assume a hands and knees position on a firm surface. Keep your hands under your shoulders and your knees under your hips. You may place padding under your knees for comfort.  Drop your head and point your tail bone toward the ground below you. This will round out your lower back like an angry cat. Hold this position for __________ seconds.  Slowly lift your head and release your tail bone so that your back sags into a large arch, like an old horse.  Hold this position for __________ seconds.  Repeat this until you feel limber in your lower back.  Now, find your "sweet spot." This will be the most comfortable position somewhere between the two previous positions. This is your neutral spine. Once you have found this position, tense your stomach muscles to support your lower back.  Hold this position for __________ seconds. Repeat __________ times. Complete this  exercise __________ times per day.  STRENGTHENING EXERCISES - Low Back Strain These exercises may help you when beginning to rehabilitate your injury. These exercises should be done near your "sweet spot." This is the neutral, low-back arch, somewhere between fully rounded and fully arched, that is your least painful position. When performed in this safe range of motion, these exercises can be used for people who have either a flexion or extension based injury. These exercises may resolve your symptoms with or without further involvement from your physician, physical therapist or athletic trainer. While completing these exercises, remember:   Muscles can gain both the endurance and the strength needed for everyday activities through controlled exercises.  Complete these exercises as instructed by your physician, physical therapist or athletic trainer. Increase the resistance and repetitions only as guided.  You may experience muscle soreness or fatigue, but the pain or discomfort you are trying to eliminate should never worsen during  these exercises. If this pain does worsen, stop and make certain you are following the directions exactly. If the pain is still present after adjustments, discontinue the exercise until you can discuss the trouble with your caregiver. STRENGTHENING - Deep Abdominals, Pelvic Tilt  Lie on a firm bed or floor. Keeping your legs in front of you, bend your knees so they are both pointed toward the ceiling and your feet are flat on the floor.  Tense your lower abdominal muscles to press your lower back into the floor. This motion will rotate your pelvis so that your tail bone is scooping upwards rather than pointing at your feet or into the floor.  With a gentle tension and even breathing, hold this position for __________ seconds. Repeat __________ times. Complete this exercise __________ times per day.  STRENGTHENING - Abdominals, Crunches   Lie on a firm bed or floor. Keeping your legs in front of you, bend your knees so they are both pointed toward the ceiling and your feet are flat on the floor. Cross your arms over your chest.  Slightly tip your chin down without bending your neck.  Tense your abdominals and slowly lift your trunk high enough to just clear your shoulder blades. Lifting higher can put excessive stress on the lower back and does not further strengthen your abdominal muscles.  Control your return to the starting position. Repeat __________ times. Complete this exercise __________ times per day.  STRENGTHENING - Quadruped, Opposite UE/LE Lift   Assume a hands and knees position on a firm surface. Keep your hands under your shoulders and your knees under your hips. You may place padding under your knees for comfort.  Find your neutral spine and gently tense your abdominal muscles so that you can maintain this position. Your shoulders and hips should form a rectangle that is parallel with the floor and is not twisted.  Keeping your trunk steady, lift your right hand no higher than  your shoulder and then your left leg no higher than your hip. Make sure you are not holding your breath. Hold this position __________ seconds.  Continuing to keep your abdominal muscles tense and your back steady, slowly return to your starting position. Repeat with the opposite arm and leg. Repeat __________ times. Complete this exercise __________ times per day.  STRENGTHENING - Lower Abdominals, Double Knee Lift  Lie on a firm bed or floor. Keeping your legs in front of you, bend your knees so they are both pointed toward the ceiling and your feet are flat on the  floor.  Tense your abdominal muscles to brace your lower back and slowly lift both of your knees until they come over your hips. Be certain not to hold your breath.  Hold __________ seconds. Using your abdominal muscles, return to the starting position in a slow and controlled manner. Repeat __________ times. Complete this exercise __________ times per day.  POSTURE AND BODY MECHANICS CONSIDERATIONS - Low Back Strain Keeping correct posture when sitting, standing or completing your activities will reduce the stress put on different body tissues, allowing injured tissues a chance to heal and limiting painful experiences. The following are general guidelines for improved posture. Your physician or physical therapist will provide you with any instructions specific to your needs. While reading these guidelines, remember:  The exercises prescribed by your provider will help you have the flexibility and strength to maintain correct postures.  The correct posture provides the best environment for your joints to work. All of your joints have less wear and tear when properly supported by a spine with good posture. This means you will experience a healthier, less painful body.  Correct posture must be practiced with all of your activities, especially prolonged sitting and standing. Correct posture is as important when doing repetitive  low-stress activities (typing) as it is when doing a single heavy-load activity (lifting). RESTING POSITIONS Consider which positions are most painful for you when choosing a resting position. If you have pain with flexion-based activities (sitting, bending, stooping, squatting), choose a position that allows you to rest in a less flexed posture. You would want to avoid curling into a fetal position on your side. If your pain worsens with extension-based activities (prolonged standing, working overhead), avoid resting in an extended position such as sleeping on your stomach. Most people will find more comfort when they rest with their spine in a more neutral position, neither too rounded nor too arched. Lying on a non-sagging bed on your side with a pillow between your knees, or on your back with a pillow under your knees will often provide some relief. Keep in mind, being in any one position for a prolonged period of time, no matter how correct your posture, can still lead to stiffness. PROPER SITTING POSTURE In order to minimize stress and discomfort on your spine, you must sit with correct posture. Sitting with good posture should be effortless for a healthy body. Returning to good posture is a gradual process. Many people can work toward this most comfortably by using various supports until they have the flexibility and strength to maintain this posture on their own. When sitting with proper posture, your ears will fall over your shoulders and your shoulders will fall over your hips. You should use the back of the chair to support your upper back. Your lower back will be in a neutral position, just slightly arched. You may place a small pillow or folded towel at the base of your lower back for support.  When working at a desk, create an environment that supports good, upright posture. Without extra support, muscles tire, which leads to excessive strain on joints and other tissues. Keep these  recommendations in mind: CHAIR:  A chair should be able to slide under your desk when your back makes contact with the back of the chair. This allows you to work closely.  The chair's height should allow your eyes to be level with the upper part of your monitor and your hands to be slightly lower than your elbows. BODY POSITION  Your feet should make contact with the floor. If this is not possible, use a foot rest.  Keep your ears over your shoulders. This will reduce stress on your neck and lower back. INCORRECT SITTING POSTURES  If you are feeling tired and unable to assume a healthy sitting posture, do not slouch or slump. This puts excessive strain on your back tissues, causing more damage and pain. Healthier options include:  Using more support, like a lumbar pillow.  Switching tasks to something that requires you to be upright or walking.  Talking a brief walk.  Lying down to rest in a neutral-spine position. PROLONGED STANDING WHILE SLIGHTLY LEANING FORWARD  When completing a task that requires you to lean forward while standing in one place for a long time, place either foot up on a stationary 2-4 inch high object to help maintain the best posture. When both feet are on the ground, the lower back tends to lose its slight inward curve. If this curve flattens (or becomes too large), then the back and your other joints will experience too much stress, tire more quickly, and can cause pain. CORRECT STANDING POSTURES Proper standing posture should be assumed with all daily activities, even if they only take a few moments, like when brushing your teeth. As in sitting, your ears should fall over your shoulders and your shoulders should fall over your hips. You should keep a slight tension in your abdominal muscles to brace your spine. Your tailbone should point down to the ground, not behind your body, resulting in an over-extended swayback posture.  INCORRECT STANDING POSTURES  Common  incorrect standing postures include a forward head, locked knees and/or an excessive swayback. WALKING Walk with an upright posture. Your ears, shoulders and hips should all line-up. PROLONGED ACTIVITY IN A FLEXED POSITION When completing a task that requires you to bend forward at your waist or lean over a low surface, try to find a way to stabilize 3 out of 4 of your limbs. You can place a hand or elbow on your thigh or rest a knee on the surface you are reaching across. This will provide you more stability so that your muscles do not fatigue as quickly. By keeping your knees relaxed, or slightly bent, you will also reduce stress across your lower back. CORRECT LIFTING TECHNIQUES DO :   Assume a wide stance. This will provide you more stability and the opportunity to get as close as possible to the object which you are lifting.  Tense your abdominals to brace your spine. Bend at the knees and hips. Keeping your back locked in a neutral-spine position, lift using your leg muscles. Lift with your legs, keeping your back straight.  Test the weight of unknown objects before attempting to lift them.  Try to keep your elbows locked down at your sides in order get the best strength from your shoulders when carrying an object.  Always ask for help when lifting heavy or awkward objects. INCORRECT LIFTING TECHNIQUES DO NOT:   Lock your knees when lifting, even if it is a small object.  Bend and twist. Pivot at your feet or move your feet when needing to change directions.  Assume that you can safely pick up even a paper clip without proper posture. Document Released: 10/19/2005 Document Revised: 01/11/2012 Document Reviewed: 01/31/2009 [facility] Patient Information 2015 [place], [place]. This information is not intended to replace advice given to you by your health care provider. Make sure you discuss any questions you  have with your health care provider.  

## 2015-04-09 LAB — IFOBT (OCCULT BLOOD): IFOBT: NEGATIVE

## 2015-09-30 ENCOUNTER — Encounter: Payer: Self-pay | Admitting: Internal Medicine

## 2016-08-24 ENCOUNTER — Ambulatory Visit (INDEPENDENT_AMBULATORY_CARE_PROVIDER_SITE_OTHER): Payer: Self-pay | Admitting: Physician Assistant

## 2016-08-24 VITALS — BP 130/84 | HR 77 | Temp 97.5°F | Resp 17 | Ht 74.0 in | Wt 246.0 lb

## 2016-08-24 DIAGNOSIS — M545 Low back pain: Secondary | ICD-10-CM

## 2016-08-24 DIAGNOSIS — M62838 Other muscle spasm: Secondary | ICD-10-CM

## 2016-08-24 MED ORDER — MELOXICAM 15 MG PO TABS: 7.5000 mg | ORAL_TABLET | Freq: Every day | ORAL | 0 refills | Status: AC

## 2016-08-24 MED ORDER — CYCLOBENZAPRINE HCL 10 MG PO TABS: 5.0000 mg | ORAL_TABLET | Freq: Three times a day (TID) | ORAL | 0 refills | Status: DC | PRN

## 2016-08-24 NOTE — Progress Notes (Signed)
By signing my name below, I, Raven Small, attest that this documentation has been prepared under the direction and in the presence of Deliah Boston, PA-C.  Electronically Signed: Andrew Au, ED Scribe. 08/24/2016. 4:27 PM.  08/24/2016 4:27 PM   DOB: [birth year] / MRN: [id number]  SUBJECTIVE:  Mitchell Barry is a 50 y.o. male presenting for right, low back pain that started a 3 weeks ago but worsened 3 days ago. 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 He reports sensation of pain and numbness that radiates to right medial thigh, but no pain traveling down right leg posteriorly. He has tried  2 days of  Ibuprofen, left over meloxicam, left over hydrocodone, aleve today as well as ice, heat and icy hot. No trauma. He denies bladders and bowel incontinence. He denies leg weakness and hematuria. . No hx of DM. Pt is not a smoker.   He has No Known Allergies.   He  has a past medical history of Nephrolithiasis.    He  reports that he has never smoked. He does not have any smokeless tobacco history on file. He reports that he does not drink alcohol or use drugs. He  has no sexual activity history on file. The patient  has no past surgical history on file.  His family history includes Diabetes in his father; Heart disease in his father.  Review of Systems  Constitutional: Negative for fever.  Genitourinary: Negative for hematuria.  Musculoskeletal: Positive for back pain and myalgias. Negative for falls.  Skin: Negative for rash.  Neurological: Positive for tingling. Negative for focal weakness.    The problem list and medications were reviewed and updated by myself where necessary and exist elsewhere in the encounter.   OBJECTIVE:  BP 130/84 (BP Location: Right Arm, Patient Position: Sitting, Cuff Size: Large)   Pulse 77   Temp 97.5 F (36.4 C) (Oral)   Resp 17   Ht 6\' 2"  (1.88 m)   Wt 246 lb (111.6 kg)   SpO2 98%   BMI 31.58 kg/m    Physical Exam  Constitutional: He is oriented to person, place, and time. Vital signs are normal. He appears well-developed.  Cardiovascular: Normal rate.   Pulmonary/Chest: Effort normal. No respiratory distress.  Musculoskeletal: Normal range of motion.  Muscle spasm about right piriformis. No rash. No swelling. No changes to soft touch about the lower extremity dermatones.   Neurological: He is alert and oriented to person, place, and time.  Good strength in the lower extremities.   Skin: Skin is warm and dry.  Psychiatric: He has a normal mood and affect. His behavior is normal. Judgment and thought content normal.   ASSESSMENT AND PLAN  [name] was seen today for back pain.  Diagnoses and all orders for this visit:  Low back pain, unspecified back pain laterality, unspecified chronicity, with sciatica presence unspecified: He is having some radiculopathy per HPI.  However no findings on exam and no red flags.  If he is not getting better in a week he will call and I will prescribe pred.  -     meloxicam (MOBIC) 15 MG tablet; Take 0.5-1 tablets (7.5-15 mg total) by mouth daily. Take with food. Do not take Ibuprofen, Goody's, or Aleve while taking this medication.  Spasm of right piriformis muscle -     cyclobenzaprine (FLEXERIL) 10 MG tablet; Take 0.5-1 tablets (5-10 mg total) by mouth 3 (three) times daily as needed for muscle spasms (  May cause drowsiness. Do no operate heavy machinery while taking.).    This note was scribed in my presence and I performed the services described in the this documentation.   The patient is advised to call or return to clinic if he does not see an improvement in symptoms, or to seek the care of the closest emergency department if he worsens with the above plan.   Deliah BostonMichael Clark, MHS, PA-C [facility] 08/24/2016 4:27 PM

## 2016-08-24 NOTE — Patient Instructions (Addendum)
  Call me in a week if you are not feeling better and I will plan to step up your therapy to prednisone.    IF you received an x-ray today, you will receive an invoice from [facility]. Please contact [facility] at [phone] with questions or concerns regarding your invoice.   IF you received labwork today, you will receive an invoice from [facility]. Please contact [facility] at [phone] with questions or concerns regarding your invoice.   Our billing staff will not be able to assist you with questions regarding bills from these companies.  You will be contacted with the lab results as soon as they are available. The fastest way to get your results is to activate your My Chart account. Instructions are located on the last page of this paperwork. If you have not heard from us regarding the results in 2 weeks, please contact this office.

## 2016-08-27 ENCOUNTER — Telehealth: Payer: Self-pay | Admitting: Family Medicine

## 2016-08-27 NOTE — Telephone Encounter (Signed)
Pt calling stating that the mobic and flexeril isnt working at all for his back he is in pain and would like to have a MRI please respond

## 2016-08-27 NOTE — Telephone Encounter (Signed)
He needs more time.  We can consider prednisone as well. If he insist upon MRI I can send him to orthopedics but his insurance may not cover as he has not been taking an NSAID for 6 weeks.  I'd like to given him 3 more days before trying pred. Deliah BostonMichael Jerusalem Brownstein, MS, PA-C 1:34 PM, 08/27/2016

## 2016-08-28 NOTE — Telephone Encounter (Signed)
Pt wife Archie Pattentonya is calling saying he can barely walk and would like to have that MRI and if anything else could be prescribed to him in the mean time   Contact number: [phone]

## 2016-08-29 ENCOUNTER — Emergency Department (HOSPITAL_COMMUNITY)
Admission: EM | Admit: 2016-08-29 | Discharge: 2016-08-30 | Disposition: A | Discharge status: Home / Self Care | Payer: Self-pay | Attending: Emergency Medicine | Admitting: Emergency Medicine

## 2016-08-29 ENCOUNTER — Emergency Department (HOSPITAL_COMMUNITY): Payer: Self-pay

## 2016-08-29 ENCOUNTER — Encounter (HOSPITAL_COMMUNITY): Payer: Self-pay | Admitting: *Deleted

## 2016-08-29 DIAGNOSIS — M25551 Pain in right hip: Secondary | ICD-10-CM

## 2016-08-29 DIAGNOSIS — R52 Pain, unspecified: Secondary | ICD-10-CM

## 2016-08-29 DIAGNOSIS — M545 Low back pain: Secondary | ICD-10-CM

## 2016-08-29 DIAGNOSIS — M25461 Effusion, right knee: Secondary | ICD-10-CM | POA: Insufficient documentation

## 2016-08-29 DIAGNOSIS — M5440 Lumbago with sciatica, unspecified side: Secondary | ICD-10-CM | POA: Insufficient documentation

## 2016-08-29 NOTE — ED Notes (Signed)
To x-ray

## 2016-08-29 NOTE — ED Provider Notes (Signed)
[facility]-EMERGENCY DEPT Provider Note   CSN: [id number] Arrival date & time: 08/29/16  2118     History   Chief Complaint Chief Complaint  Patient presents with  . Back Pain  . Knee Pain    HPI Mitchell Barry is a 50 y.o. male.  HPI   Patient is a 50 year old male with a history of nephrolithiasis who presents to the ED with right-sided lower back pain has progressively worsened over the last 8 days. Patient was seen at urgent care 2 days ago and prescribed meloxicam and a muscle relaxer. He states these have not helped. He states his pain is in his right-sided lower back and radiates around to his anterior thigh. Patient denies pain at rest. He says pain is worse with standing and walking, achy, 9/10. Patient also had 2 massages which did not help. He states he had similar back pain in the past but never this bad. He states sitting relieves his back/hip pain. Patient also complaining of right knee pain for roughly 1 week. Patient has a history of knee effusions in that knee. He states status associated swelling. Patient's been using ice which has helped. Patient denies numbness/stealing, weakness, fever, chills, abdominal pain, nausea, vomiting, urinary retention, loss of bowel bladder function, hx of IVD use.  Past Medical History:  Diagnosis Date  . Nephrolithiasis     Patient Active Problem List   Diagnosis Date Noted  . Chest pain 08/24/2011    History reviewed. No pertinent surgical history.     Home Medications    Prior to Admission medications   Medication Sig Start Date End Date Taking? Authorizing Provider  cyclobenzaprine (FLEXERIL) 10 MG tablet Take 0.5-1 tablets (5-10 mg total) by mouth 3 (three) times daily as needed for muscle spasms (May cause drowsiness. Do no operate heavy machinery while taking.). 08/24/16   Ofilia NeasMichael L Clark, PA-C  meloxicam (MOBIC) 15 MG tablet Take 0.5-1 tablets (7.5-15 mg total) by mouth daily. Take with food. Do not take Ibuprofen,  Goody's, or Aleve while taking this medication. 08/24/16 09/23/16  Ofilia NeasMichael L Clark, PA-C    Family History Family History  Problem Relation Age of Onset  . Diabetes Father   . Heart disease Father     Social History Social History  Substance Use Topics  . Smoking status: Never Smoker  . Smokeless tobacco: Never Used  . Alcohol use No     Allergies   Review of patient's allergies indicates no known allergies.   Review of Systems Review of Systems  Constitutional: Negative for fever.  Gastrointestinal: Negative for abdominal pain, nausea and vomiting.  Genitourinary: Negative for difficulty urinating.  Musculoskeletal: Positive for arthralgias and back pain.  Skin: Negative for rash.     Physical Exam Updated Vital Signs BP 141/96 (BP Location: Right Arm)   Pulse 82   Temp 97.6 F (36.4 C) (Oral)   Resp 20   Wt 111.6 kg   SpO2 97%   BMI 31.58 kg/m   Physical Exam  Constitutional: He appears well-developed and well-nourished. No distress.  HENT:  Head: Normocephalic and atraumatic.  Eyes: Conjunctivae are normal.  Cardiovascular:  Pulses:      Dorsalis pedis pulses are 2+ on the right side, and 2+ on the left side.  Pulmonary/Chest: Effort normal. No respiratory distress.  Musculoskeletal: Normal range of motion.  No deformities, step off's, ecchymosis, edema, erythema noted to back or right hip. Good ROM of T and L spine. No spinous process tenderness of  T or L spine. TTP to the paraspinal muscles bilaterally greater on the right and SI joint on the right. Pain with straight leg raise  Mild TTP to lateral right hip. Full ROM of BLE, no pain with internal or external rotation of hip.   Right knee with mild effusion. Limited ROM due to pain. No erythema or increased warmth. Mild TTP to bilateral joint lines.   Sensation intact of BLE, 2+ DP pulses, pt is NVI distally. Normal gate and balance    Neurological: He is alert. He has normal strength. No sensory  deficit. Coordination normal.  Skin: Skin is warm and dry. He is not diaphoretic.  Psychiatric: He has a normal mood and affect. His behavior is normal.  Nursing note and vitals reviewed.    ED Treatments / Results  Labs (all labs ordered are listed, but only abnormal results are displayed) Labs Reviewed - No data to display  EKG  EKG Interpretation None       Radiology  Dg Lumbar Spine 2-3 Views  Result Date: 08/29/2016 CLINICAL DATA:  Low back pain EXAM: LUMBAR SPINE - 2-3 VIEW COMPARISON:  None. FINDINGS: Five non-rib-bearing lumbar type vertebra. Vertebral body heights are grossly maintained. Lumbar alignment is within normal limits. Mild narrowing at L5-S1. Small anterior osteophytes. Probable chronic mild anterior wedging of T11. IMPRESSION: 1. No acute osseous abnormality. 2. Probable chronic mild anterior wedging of T11. Electronically Signed   By: Jasmine Pang M.D.   On: 08/29/2016 23:42   Dg Hip Unilat W Or Wo Pelvis 2-3 Views Right  Result Date: 08/29/2016 CLINICAL DATA:  Low back pain and right hip pain EXAM: DG HIP (WITH OR WITHOUT PELVIS) 2-3V RIGHT COMPARISON:  None. FINDINGS: SI joints are patent. Pubic symphysis is intact. No fracture or dislocation. Mild degenerative changes of the bilateral hips. Prominent osseous bumps bilaterally at the femoral head neck junctions. Calcified pelvic phleboliths. IMPRESSION: No acute osseous abnormality Electronically Signed   By: Jasmine Pang M.D.   On: 08/29/2016 23:40     Procedures Procedures (including critical care time)  Medications Ordered in ED Medications - No data to display   Initial Impression / Assessment and Plan / ED Course  I have reviewed the triage vital signs and the nursing notes.  Pertinent labs & imaging results that were available during my care of the patient were reviewed by me and considered in my medical decision making (see chart for details).  Clinical Course   Patient with back pain.   No neurological deficits and normal neuro exam.  Patient can walk but states is painful.  No loss of bowel or bladder control.  No concern for cauda equina.  No fever, night sweats, weight loss, h/o cancer, IVDU. Pt with hip pain. Family hx of hip arthritis. Xrays reviewed by me were negative for any acute abnormalities. Xrays did reveal mild degenerative changes of the bilateral hips, mild narrowing at L5-S1. Mild knee effusion and pt states the swelling has decreased. Not large enough to aspirate and pt is comfortable to continue conservative treatment.  RICE protocol and pain medicine indicated and discussed with patient for back, hip and knee. Since the NSAID and muscle relaxant were not improving pts symptoms we discussed a burst of steroids. Pt is not diabetic. Review of his EMR and note from PCP stated if he failed initial treatment then they would try steroids.   Instructed pt to f/u with PCP or orthopedics in 3-4 days if symptoms are not  improving. Pt afebrile, VSS and stable at time of discharge. Discussed stool softener with use of Norco. Discussed strict return precautions. Pt expressed understanding to the discharge instructions.   Final Clinical Impressions(s) / ED Diagnoses   Final diagnoses:  Pain  Right low back pain, unspecified chronicity, with sciatica presence unspecified  Effusion of right knee  Right hip pain    New Prescriptions New Prescriptions   No medications on file     Jerre SimonJessica L Khristi Schiller, [place]PA 09/01/16 1605    Gerhard Munchobert Lockwood, MD 09/03/16 1737

## 2016-08-29 NOTE — ED Triage Notes (Signed)
Patient presents with c/o right lower back pain (dx with siatica) and right knee pain (thinks he may have fluid on the knee) history of the same

## 2016-08-30 MED ORDER — PREDNISONE 20 MG PO TABS
50.0000 mg | ORAL_TABLET | Freq: Once | ORAL | Status: AC
  Administered 2016-08-30: 50 mg via ORAL
  Filled 2016-08-30: qty 3

## 2016-08-30 MED ORDER — PREDNISONE 10 MG PO TABS: 40.0000 mg | ORAL_TABLET | Freq: Every day | ORAL | 0 refills | Status: AC

## 2016-08-30 MED ORDER — HYDROCODONE-ACETAMINOPHEN 5-325 MG PO TABS: 1.0000 | ORAL_TABLET | Freq: Four times a day (QID) | ORAL | 0 refills | Status: DC | PRN

## 2016-08-30 NOTE — Discharge Instructions (Signed)
Take the prednisone as prescribed and be sure to complete the entire course. Be sure to eat with this medication as it can be hard in your stomach. Do not take the meloxicam while taking the prednisone. Continue to take the muscle relaxant as prescribed. Take the Norco at night as needed for pain. Follow-up with your primary care provider in 3-4 days if your symptoms are not improving. Return immediately to the emergency department if you experience numbness/timing, weakness, fever, inability to urinate, loss of bowel bladder function, any other concerning symptoms.

## 2017-01-25 ENCOUNTER — Ambulatory Visit (INDEPENDENT_AMBULATORY_CARE_PROVIDER_SITE_OTHER): Payer: Self-pay | Admitting: Emergency Medicine

## 2017-01-25 VITALS — BP 136/80 | HR 67 | Temp 97.4°F | Resp 16 | Ht 73.5 in | Wt 244.6 lb

## 2017-01-25 DIAGNOSIS — Z0000 Encounter for general adult medical examination without abnormal findings: Secondary | ICD-10-CM

## 2017-01-25 NOTE — Progress Notes (Signed)
Mitchell [facility] W [facility] 51 [facility]   Chief Complaint  Patient presents with  . PAPERWORK    HISTORY OF PRESENT ILLNESS: This is a 51 y.o. 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  HPI   Prior to Admission medications   Medication Sig Start Date End Date Taking? Authorizing Provider  cyclobenzaprine (FLEXERIL) 10 MG tablet Take 0.5-1 tablets (5-10 mg total) by mouth 3 (three) times daily as needed for muscle spasms (May cause drowsiness. Do no operate heavy machinery while taking.). Patient not taking: Reported on 01/25/2017 08/24/16   Ofilia NeasMichael L Clark, PA-C  HYDROcodone-acetaminophen (NORCO/VICODIN) 5-325 MG tablet Take 1-2 tablets by mouth every 6 (six) hours as needed for severe pain. Patient not taking: Reported on 01/25/2017 08/30/16   Jerre SimonJessica L Focht, PA    No Known Allergies  Patient Active Problem List   Diagnosis Date Noted  . Chest pain 08/24/2011    Past Medical History:  Diagnosis Date  . Nephrolithiasis     No past surgical history on file.  Social History   Social History  . Marital status: Married    Spouse name: [name]  . Number of children: 1  . Years of education: N/A   Occupational History  . [employer]   Social History Main Topics  . Smoking status: Never Smoker  . Smokeless tobacco: Never Used  . Alcohol use No  . Drug use: No  . Sexual activity: Yes   Other Topics Concern  . Not on file   Social History Narrative  . No narrative on file    Family History  Problem Relation Age of Onset  . Diabetes Father   . Heart disease Father      Review of Systems  Constitutional: Negative.  Negative for chills and fever.  HENT: Negative.   Eyes: Negative.  Negative for blurred vision and double vision.  Respiratory: Negative.  Negative for cough, shortness of breath and wheezing.   Cardiovascular: Negative.  Negative for chest pain, palpitations, claudication and leg  swelling.  Gastrointestinal: Negative.  Negative for abdominal pain, diarrhea, nausea and vomiting.  Genitourinary: Negative.  Negative for dysuria, flank pain and hematuria.  Musculoskeletal: Negative.  Negative for back pain, myalgias and neck pain.  Skin: Negative.  Negative for rash.  Neurological: Negative.  Negative for weakness.  Endo/Heme/Allergies: Negative.   Psychiatric/Behavioral: Negative.   All other systems reviewed and are negative.  Vitals:   01/25/17 1005  BP: 136/80  Pulse: 67  Resp: 16  Temp: 97.4 F (36.3 C)     Physical Exam  Constitutional: He is oriented to person, place, and time. He appears well-developed and well-nourished.  HENT:  Head: Normocephalic and atraumatic.  Right Ear: External ear normal.  Left Ear: External ear normal.  Nose: Nose normal.  Mouth/Throat: Oropharynx is clear and moist. No oropharyngeal exudate.  Eyes: Conjunctivae and EOM are normal. Pupils are equal, round, and reactive to light.  Neck: Normal range of motion. Neck supple. No JVD present. No thyromegaly present.  Cardiovascular: Normal rate, regular rhythm, normal heart sounds and intact distal pulses.   Pulmonary/Chest: Effort normal and breath sounds normal. He has no wheezes. He has no rales.  Abdominal: Soft. Bowel sounds are normal. He exhibits no distension and no mass. There is no tenderness.  Musculoskeletal: Normal range of motion.  Lymphadenopathy:    He has no cervical adenopathy.  Neurological: He is alert and oriented to person,  place, and time. No sensory deficit. He exhibits normal muscle tone. Coordination normal.  Skin: Skin is warm and dry. Capillary refill takes less than 2 seconds. No pallor.  Psychiatric: He has a normal mood and affect. His behavior is normal.  Vitals reviewed.    ASSESSMENT & PLAN: [name] was seen today for paperwork.  Diagnoses and all orders for this visit:  Routine general medical examination at a health care  facility      Edwina Barth, MD [facility]

## 2017-01-25 NOTE — Patient Instructions (Signed)
     IF you received an x-ray today, you will receive an invoice from [facility]. Please contact [facility] at [phone] with questions or concerns regarding your invoice.   IF you received labwork today, you will receive an invoice from [facility]. Please contact [facility] at [phone] with questions or concerns regarding your invoice.   Our billing staff will not be able to assist you with questions regarding bills from these companies.  You will be contacted with the lab results as soon as they are available. The fastest way to get your results is to activate your My Chart account. Instructions are located on the last page of this paperwork. If you have not heard from us regarding the results in 2 weeks, please contact this office.     

## 2017-05-17 ENCOUNTER — Encounter (HOSPITAL_COMMUNITY): Payer: Self-pay | Admitting: Nurse Practitioner

## 2017-05-17 ENCOUNTER — Ambulatory Visit (HOSPITAL_COMMUNITY)
Admission: EM | Admit: 2017-05-17 | Discharge: 2017-05-17 | Disposition: A | Discharge status: Home / Self Care | Payer: Self-pay | Attending: Internal Medicine | Admitting: Internal Medicine

## 2017-05-17 DIAGNOSIS — G44209 Tension-type headache, unspecified, not intractable: Secondary | ICD-10-CM

## 2017-05-17 MED ORDER — DEXAMETHASONE SODIUM PHOSPHATE 10 MG/ML IJ SOLN
10.0000 mg | Freq: Once | INTRAMUSCULAR | Status: AC
  Administered 2017-05-17: 10 mg via INTRAMUSCULAR

## 2017-05-17 MED ORDER — KETOROLAC TROMETHAMINE 30 MG/ML IJ SOLN
30.0000 mg | Freq: Once | INTRAMUSCULAR | Status: AC
  Administered 2017-05-17: 30 mg via INTRAMUSCULAR

## 2017-05-17 MED ORDER — KETOROLAC TROMETHAMINE 30 MG/ML IJ SOLN
INTRAMUSCULAR | Status: AC
  Filled 2017-05-17: qty 1

## 2017-05-17 MED ORDER — DEXAMETHASONE SODIUM PHOSPHATE 10 MG/ML IJ SOLN
INTRAMUSCULAR | Status: AC
  Filled 2017-05-17: qty 1

## 2017-05-17 NOTE — ED Triage Notes (Addendum)
Pt presents with c/o headache. The headache began about ten days ago. He describes the pain as an intermittent sharp, stinging pain in the left back and left top of his head. He reports blurred vision and dizziness yesterday. He also has had some diarrhea over the past several days. He denies head injury, fever, chills, nausea, vomiting. He has tried ibuprofen, Advil, Excedrin migraine with temporary pain relief. He denies past history of headaches.

## 2017-05-17 NOTE — Discharge Instructions (Signed)
For your tension headache and recommend rest, relaxation whenever possible, over-the-counter naproxen twice daily, you can also take Tylenol with this as well. If your pain persists or fails to resolve, return to clinic as needed.

## 2017-05-17 NOTE — ED Provider Notes (Signed)
CSN: [id number]     Arrival date & time 05/17/17  1657 History   First MD Initiated Contact with Patient 05/17/17 1740     Chief Complaint  Patient presents with  . Headache   (Consider location/radiation/quality/duration/timing/severity/associated sxs/prior Treatment) The history is provided by the patient.  Headache  Pain location:  Occipital Quality:  Sharp Radiates to:  Does not radiate Severity currently:  5/10 Severity at highest:  9/10 Onset quality:  Gradual Duration:  10 days Timing:  Intermittent Progression:  Waxing and waning Chronicity:  New Similar to prior headaches: yes   Context: emotional stress   Relieved by:  Acetaminophen and NSAIDs Worsened by:  Neck movement Associated symptoms: no abdominal pain, no back pain, no blurred vision, no dizziness, no eye pain, no facial pain, no fatigue, no fever, no nausea, no neck pain, no syncope, no tingling, no vomiting and no weakness     Past Medical History:  Diagnosis Date  . Nephrolithiasis    History reviewed. No pertinent surgical history. Family History  Problem Relation Age of Onset  . Diabetes Father   . Heart disease Father    Social History  Substance Use Topics  . Smoking status: Never Smoker  . Smokeless tobacco: Never Used  . Alcohol use Yes    Review of Systems  Constitutional: Negative for fatigue and fever.  HENT: Negative.   Eyes: Negative for blurred vision and pain.  Respiratory: Negative.   Cardiovascular: Negative.  Negative for syncope.  Gastrointestinal: Negative for abdominal pain, nausea and vomiting.  Musculoskeletal: Negative for back pain and neck pain.  Skin: Negative.   Neurological: Positive for headaches. Negative for dizziness and weakness.    Allergies  Patient has no known allergies.  Home Medications   Prior to Admission medications   Not on File   Meds Ordered and Administered this Visit   Medications  ketorolac (TORADOL) 30 MG/ML injection 30 mg (30 mg  Intramuscular Given 05/17/17 1816)  dexamethasone (DECADRON) injection 10 mg (10 mg Intramuscular Given 05/17/17 1814)    BP 127/82   Pulse (!) 57   Temp 98.3 F (36.8 C) (Oral)   Resp 14   SpO2 99%  No data found.   Physical Exam  Constitutional: He is oriented to person, place, and time. He appears well-developed and well-nourished. No distress.  HENT:  Head: Normocephalic and atraumatic.  Right Ear: External ear normal.  Left Ear: External ear normal.  Eyes: Pupils are equal, round, and reactive to light. Conjunctivae and EOM are normal.  Neck: Normal range of motion.  Cardiovascular: Normal rate and regular rhythm.   Pulmonary/Chest: Effort normal and breath sounds normal.  Musculoskeletal: Normal range of motion.  Lymphadenopathy:    He has no cervical adenopathy.  Neurological: He is alert and oriented to person, place, and time. No cranial nerve deficit. He exhibits normal muscle tone.  Skin: Skin is warm and dry. Capillary refill takes less than 2 seconds. He is not diaphoretic.  Psychiatric: He has a normal mood and affect. His behavior is normal.  Nursing note and vitals reviewed.   Urgent Care Course     Procedures (including critical care time)  Labs Review Labs Reviewed - No data to display  Imaging Review No results found.     MDM   1. Tension headache    Given injection of dexamethasone and Toradol in clinic, recommend rest, continue over-the-counter nonsteroidals, if symptoms persist or fail to resolve return to clinic, if they worsen  go to the ER.     Dorena BodoKennard, Meril Dray, NP 05/17/17 1851

## 2017-09-09 ENCOUNTER — Encounter (HOSPITAL_COMMUNITY): Payer: Self-pay | Admitting: Emergency Medicine

## 2017-09-09 ENCOUNTER — Ambulatory Visit (HOSPITAL_COMMUNITY)
Admission: EM | Admit: 2017-09-09 | Discharge: 2017-09-09 | Disposition: A | Discharge status: Home / Self Care | Payer: Self-pay | Attending: Family Medicine | Admitting: Family Medicine

## 2017-09-09 DIAGNOSIS — L308 Other specified dermatitis: Secondary | ICD-10-CM

## 2017-09-09 DIAGNOSIS — W57XXXA Bitten or stung by nonvenomous insect and other nonvenomous arthropods, initial encounter: Secondary | ICD-10-CM

## 2017-09-09 DIAGNOSIS — R21 Rash and other nonspecific skin eruption: Secondary | ICD-10-CM

## 2017-09-09 MED ORDER — TRIAMCINOLONE ACETONIDE 0.1 % EX CREA: 1.0000 "application " | TOPICAL_CREAM | Freq: Two times a day (BID) | CUTANEOUS | 0 refills | Status: AC

## 2017-09-09 NOTE — ED Provider Notes (Signed)
[facility]    CSN: [id number] Arrival date & time: 09/09/17  1640     History   Chief Complaint Chief Complaint  Patient presents with  . Rash    HPI Mitchell Barry is a 51 y.o. male.   Presenting today for 2 separate rash.   Patient states that he has eczema rash on his arms that comes and goes and believes that it is flaring up currently. Eczema rash have been present for months.   He also complaints of new rash onset 2 weeks ago that "looks like insect bites" scattered throughout body.   Both rash is itchy. No fever reported.  He have tried       The history is provided by the patient.    Past Medical History:  Diagnosis Date  . Nephrolithiasis     Patient Active Problem List   Diagnosis Date Noted  . Chest pain 08/24/2011    History reviewed. No pertinent surgical history.     Home Medications    Prior to Admission medications   Medication Sig Start Date End Date Taking? Authorizing Provider  triamcinolone cream (KENALOG) 0.1 % Apply 1 application 2 (two) times daily for 10 days topically. 09/09/17 09/19/17  Lucia EstelleZheng, Jonatan Wilsey, NP    Family History Family History  Problem Relation Age of Onset  . Diabetes Father   . Heart disease Father     Social History Social History   Tobacco Use  . Smoking status: Never Smoker  . Smokeless tobacco: Never Used  Substance Use Topics  . Alcohol use: Yes  . Drug use: No     Allergies   Patient has no known allergies.   Review of Systems Review of Systems  Constitutional:       See HPI     Physical Exam Triage Vital Signs ED Triage Vitals [09/09/17 1707]  Enc Vitals Group     BP 127/77     Pulse Rate 65     Resp 18     Temp 98 F (36.7 C)     Temp Source Oral     SpO2 97 %     Weight      Height      Head Circumference      Peak Flow      Pain Score      Pain Loc      Pain Edu?      Excl. in GC?    No data found.  Updated Vital Signs BP 127/77 (BP Location: Left  Arm) Comment (BP Location): large cuff  Pulse 65   Temp 98 F (36.7 C) (Oral)   Resp 18   SpO2 97%   Visual Acuity Right Eye Distance:   Left Eye Distance:   Bilateral Distance:    Right Eye Near:   Left Eye Near:    Bilateral Near:     Physical Exam  Constitutional: He is oriented to person, place, and time. He appears well-developed and well-nourished.  HENT:  Head: Normocephalic and atraumatic.  Cardiovascular: Normal rate.  Pulmonary/Chest: Effort normal.  Neurological: He is alert and oriented to person, place, and time.  Skin:  Has a patch of dry scaly erythematous rash on right elbow consistent with eczema.   Has erythematous papules noted scattered throughout body  Nursing note and vitals reviewed.        UC Treatments / Results  Labs (all labs ordered are listed, but only abnormal results are displayed) Labs Reviewed -  No data to display  EKG  EKG Interpretation None       Radiology No results found.  Procedures Procedures (including critical care time)  Medications Ordered in UC Medications - No data to display   Initial Impression / Assessment and Plan / UC Course  I have reviewed the triage vital signs and the nursing notes.  Pertinent labs & imaging results that were available during my care of the patient were reviewed by me and considered in my medical decision making (see chart for details).   Final Clinical Impressions(s) / UC Diagnoses   Final diagnoses:  Insect bite, initial encounter  Other eczema    The chronic rash on right elbow appears to be atopic dermatitis. The erythematous papule throughout body appears to be insect bite. Will send home on Topical steroid. Avoid irritants. Take benadryl for itchiness. Return for no improvement.   ED Discharge Orders        Ordered    triamcinolone cream (KENALOG) 0.1 %  2 times daily     09/09/17 1806      Controlled Substance Prescriptions [place] Controlled Substance Registry  consulted? Not Applicable   Lucia EstelleZheng, Trinetta Alemu, NP 09/09/17 [phone]

## 2017-09-09 NOTE — ED Triage Notes (Signed)
Rash for a month, itches and burns.  Bites to trunk as well as extremities.  Thinks wife has a place on her arm.  Recently moved in with parents.

## 2018-01-15 IMAGING — DX DG HIP (WITH OR WITHOUT PELVIS) 2-3V*R*
3 series · 3 of 3 positions shown · non-contrast
Comparison: None.

CLINICAL DATA: Low back pain and right hip pain

EXAM:
DG HIP (WITH OR WITHOUT PELVIS) 2-3V RIGHT

[pelvis ap]
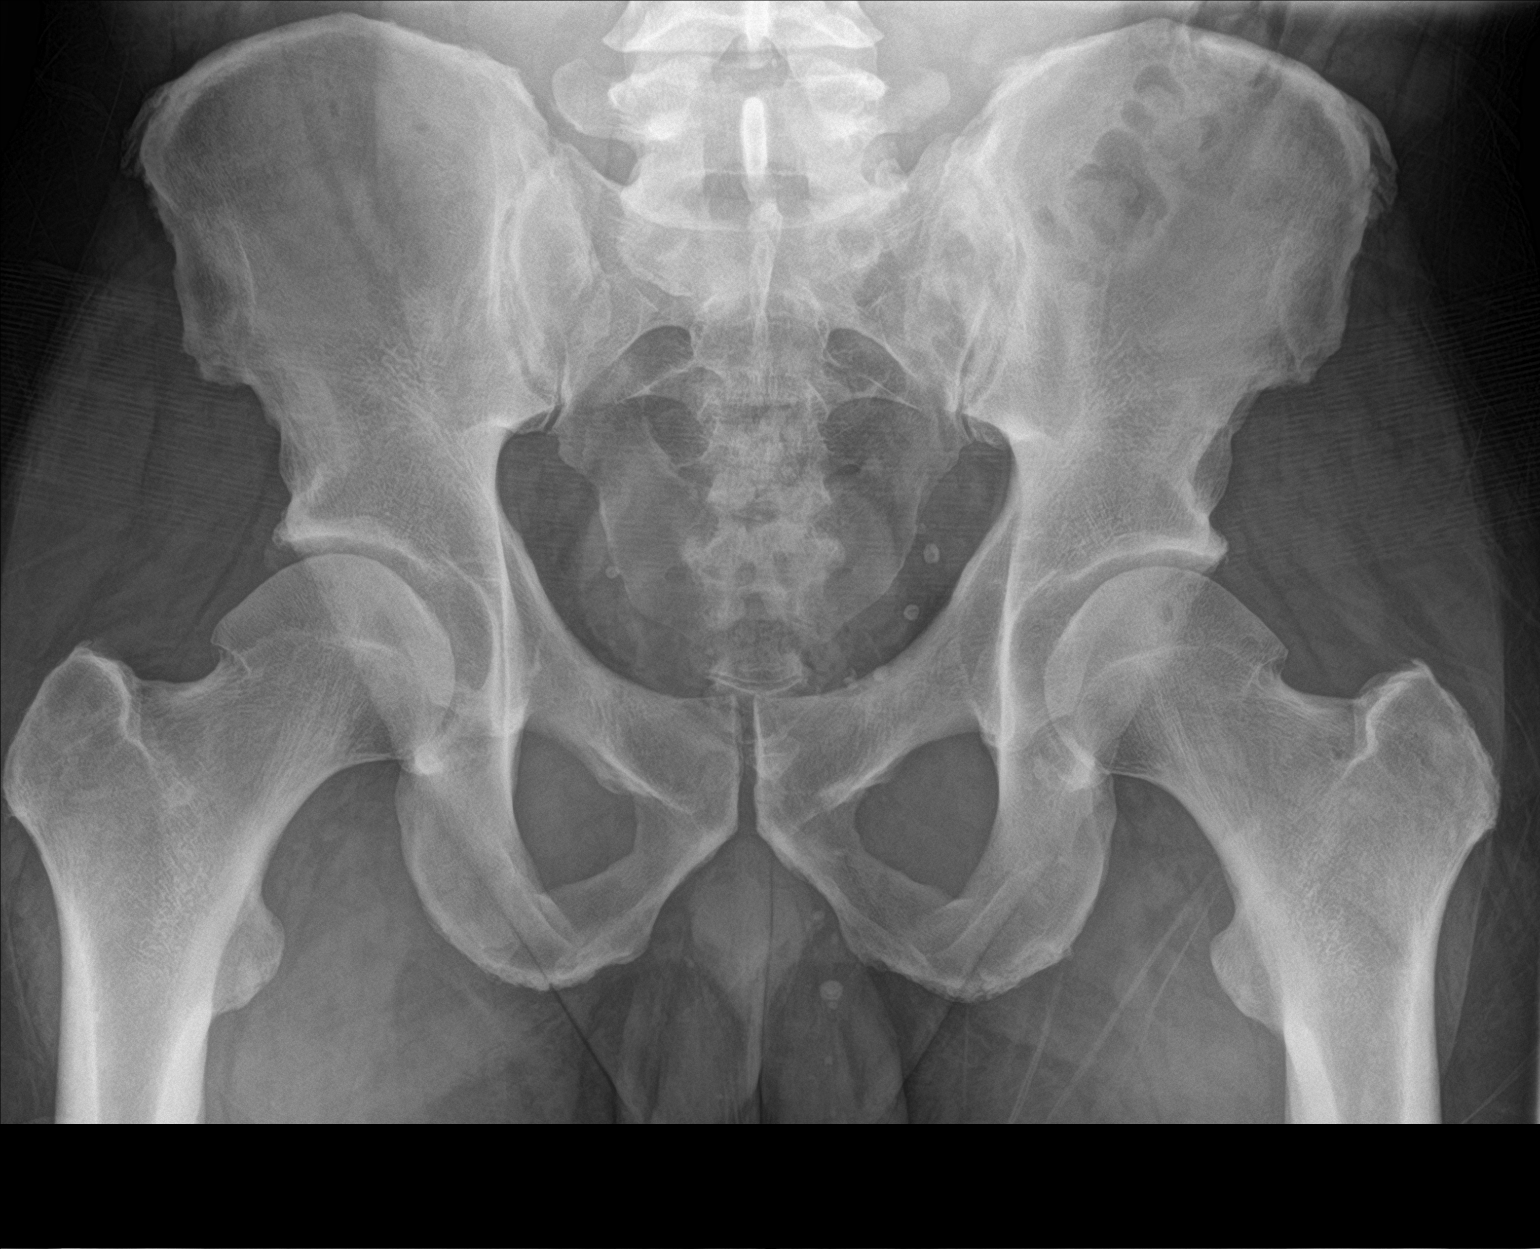

[hip ap]
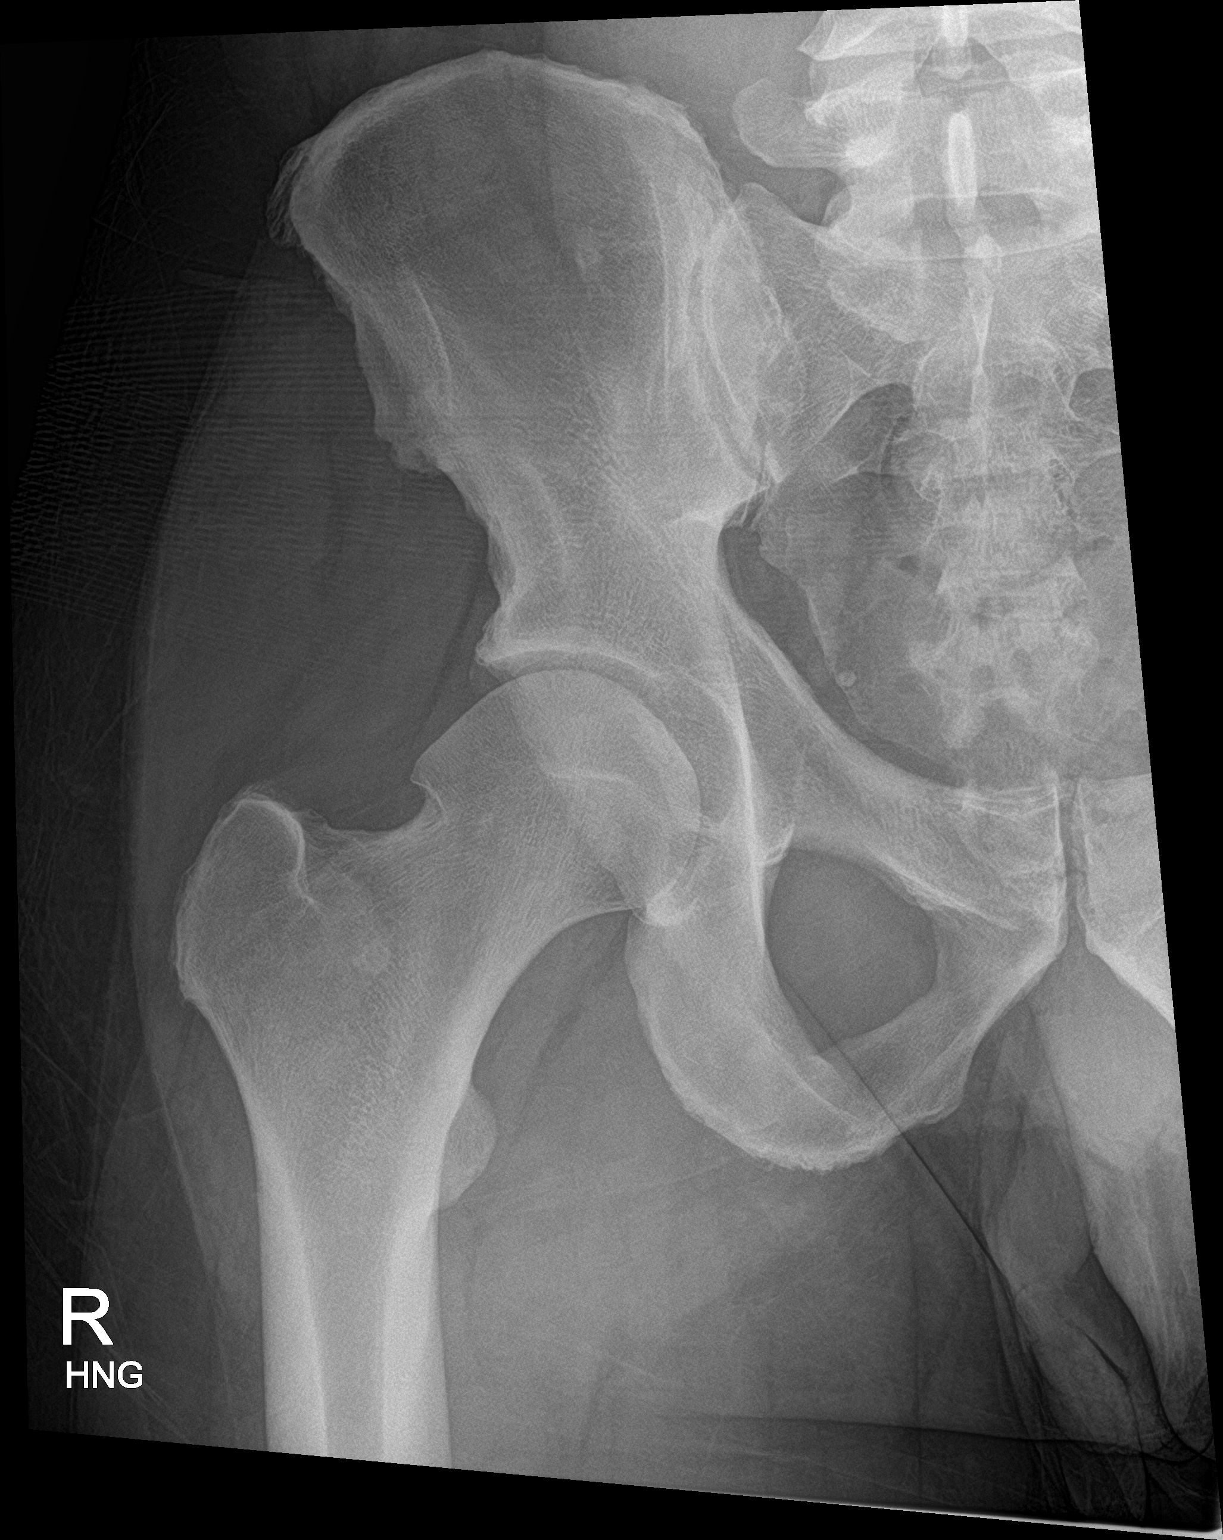

[hip lat]
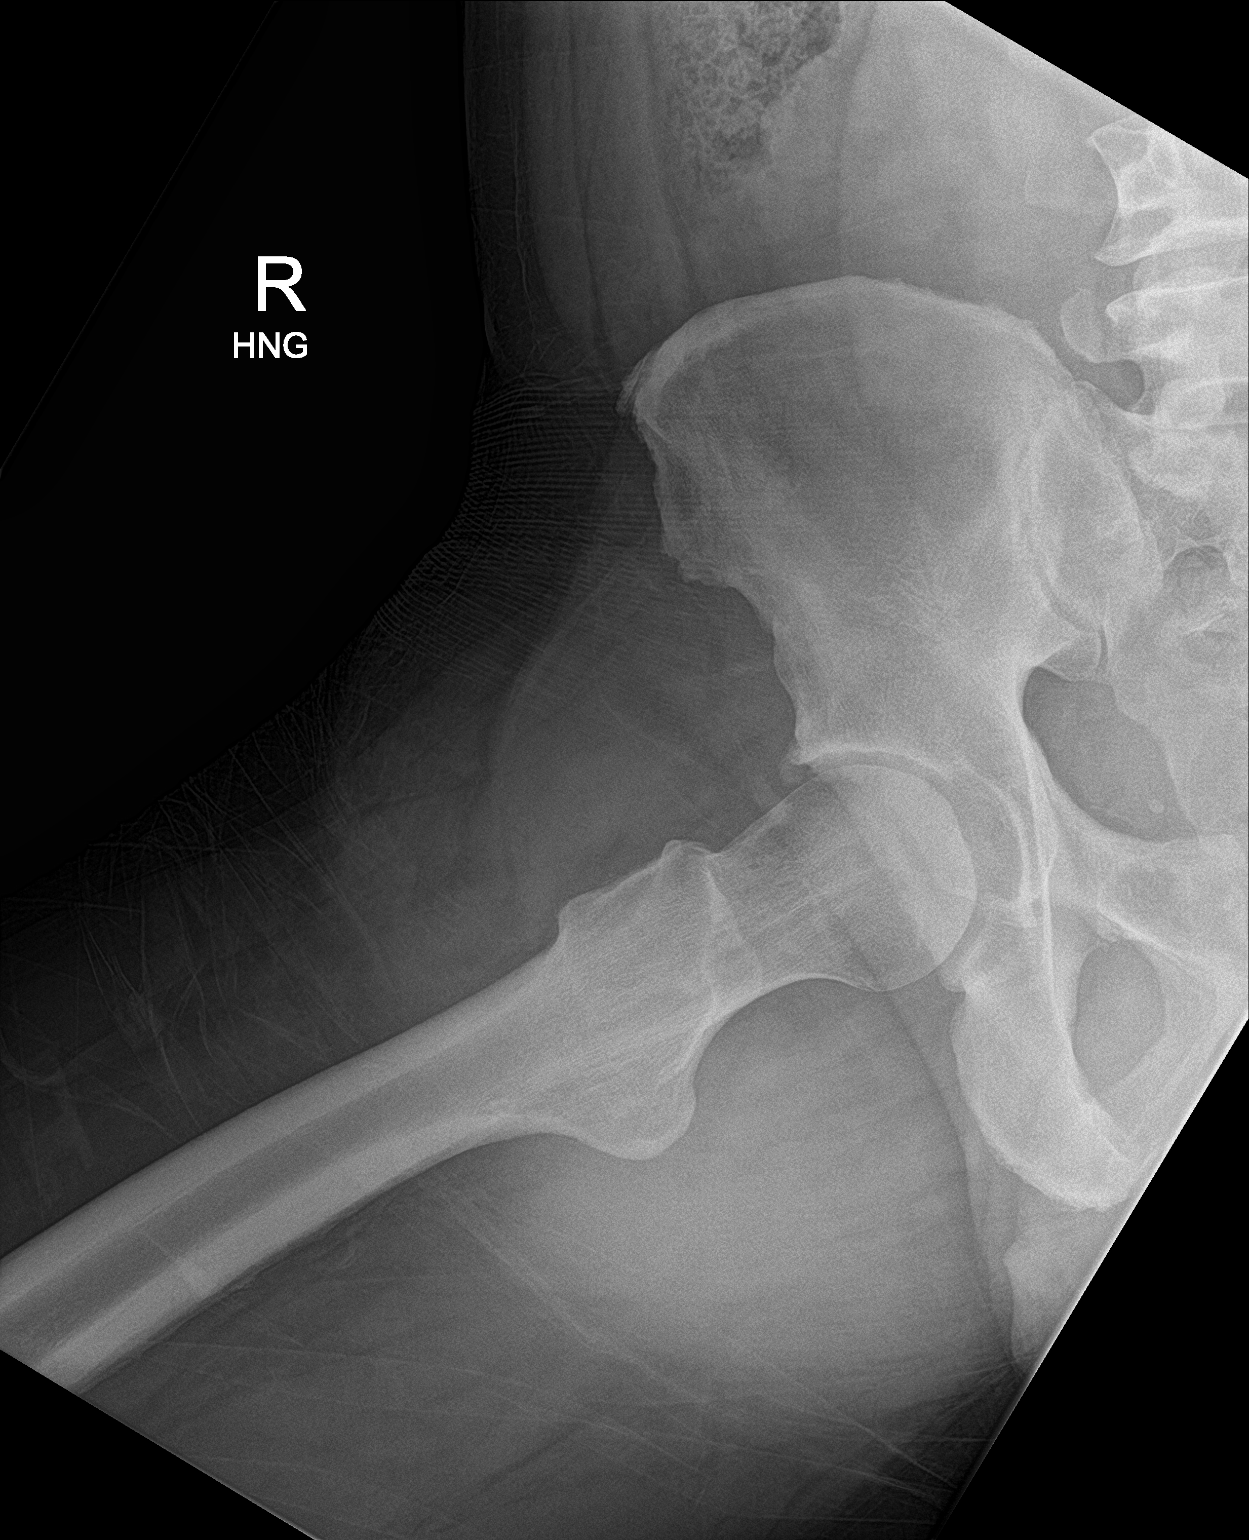

[3 of 3 positions shown; findings below may reference images not displayed]

FINDINGS: SI joints are patent. Pubic symphysis is intact. No fracture or
dislocation. Mild degenerative changes of the bilateral hips.
Prominent osseous bumps bilaterally at the femoral head neck
junctions. Calcified pelvic phleboliths.
IMPRESSION: No acute osseous abnormality

## 2019-09-26 ENCOUNTER — Other Ambulatory Visit: Payer: Self-pay

## 2019-09-26 DIAGNOSIS — Z20822 Contact with and (suspected) exposure to covid-19: Secondary | ICD-10-CM

## 2019-09-27 LAB — NOVEL CORONAVIRUS, NAA: SARS-CoV-2, NAA: NOT DETECTED

## 2019-11-22 ENCOUNTER — Ambulatory Visit: Payer: BC Managed Care – PPO | Attending: Internal Medicine

## 2019-11-22 DIAGNOSIS — Z20822 Contact with and (suspected) exposure to covid-19: Secondary | ICD-10-CM

## 2019-11-22 DIAGNOSIS — U071 COVID-19: Secondary | ICD-10-CM | POA: Insufficient documentation

## 2019-11-23 LAB — NOVEL CORONAVIRUS, NAA: SARS-CoV-2, NAA: DETECTED — AB

## 2019-11-23 NOTE — Progress Notes (Signed)
Your test for COVID-19 was positive ("detected"), meaning that you were infected with the novel coronavirus and could give the germ to others.    Please continue isolation at home, for at least 10 days since the start of your fever/cough/breathlessness and until you have had 24 hours without fever (without taking a fever reducer) and with any cough/breathlessness improving. Use over-the-counter medications for symptoms.  If you have had no symptoms, but were exposed to someone who was positive for COVID-19, you will need to quarantine and self-isolate for 14 days from the date of exposure.    Please continue good preventive care measures, including:  frequent hand-washing, avoid touching your face, cover coughs/sneezes, stay out of crowds and keep a 6 foot distance from others.  Clean hard surfaces touched frequently with disinfectant cleaning products.   Please check in with your primary care provider about your positive test result.  Go to the nearest urgent care or ED for assessment if you have severe breathlessness or severe weakness/fatigue (ex needing new help getting out of bed or to the bathroom).  Members of your household will also need to quarantine for 14 days from the date of your positive test.You may also be contacted by the health department for follow up. Please call [facility] at [phone] if you have any questions or concerns.     

## 2021-03-03 ENCOUNTER — Ambulatory Visit (INDEPENDENT_AMBULATORY_CARE_PROVIDER_SITE_OTHER): Payer: 59 | Admitting: Sports Medicine

## 2021-03-03 ENCOUNTER — Other Ambulatory Visit: Payer: Self-pay

## 2021-03-03 ENCOUNTER — Ambulatory Visit (INDEPENDENT_AMBULATORY_CARE_PROVIDER_SITE_OTHER): Payer: 59

## 2021-03-03 ENCOUNTER — Other Ambulatory Visit: Payer: 59

## 2021-03-03 VITALS — BP 138/88 | Ht 74.0 in | Wt 245.0 lb

## 2021-03-03 DIAGNOSIS — M25561 Pain in right knee: Secondary | ICD-10-CM

## 2021-03-03 MED ORDER — METHYLPREDNISOLONE ACETATE 40 MG/ML IJ SUSP
40.0000 mg | Freq: Once | INTRAMUSCULAR | Status: AC
  Administered 2021-03-03: 40 mg via INTRA_ARTICULAR
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
## 2022-07-20 IMAGING — DX DG KNEE AP/LAT W/ SUNRISE*R*
3 series · 3 of 3 positions shown · non-contrast
Comparison: None.

CLINICAL DATA: Right knee pain.

EXAM:
RIGHT KNEE 3 VIEWS

[knee ap]
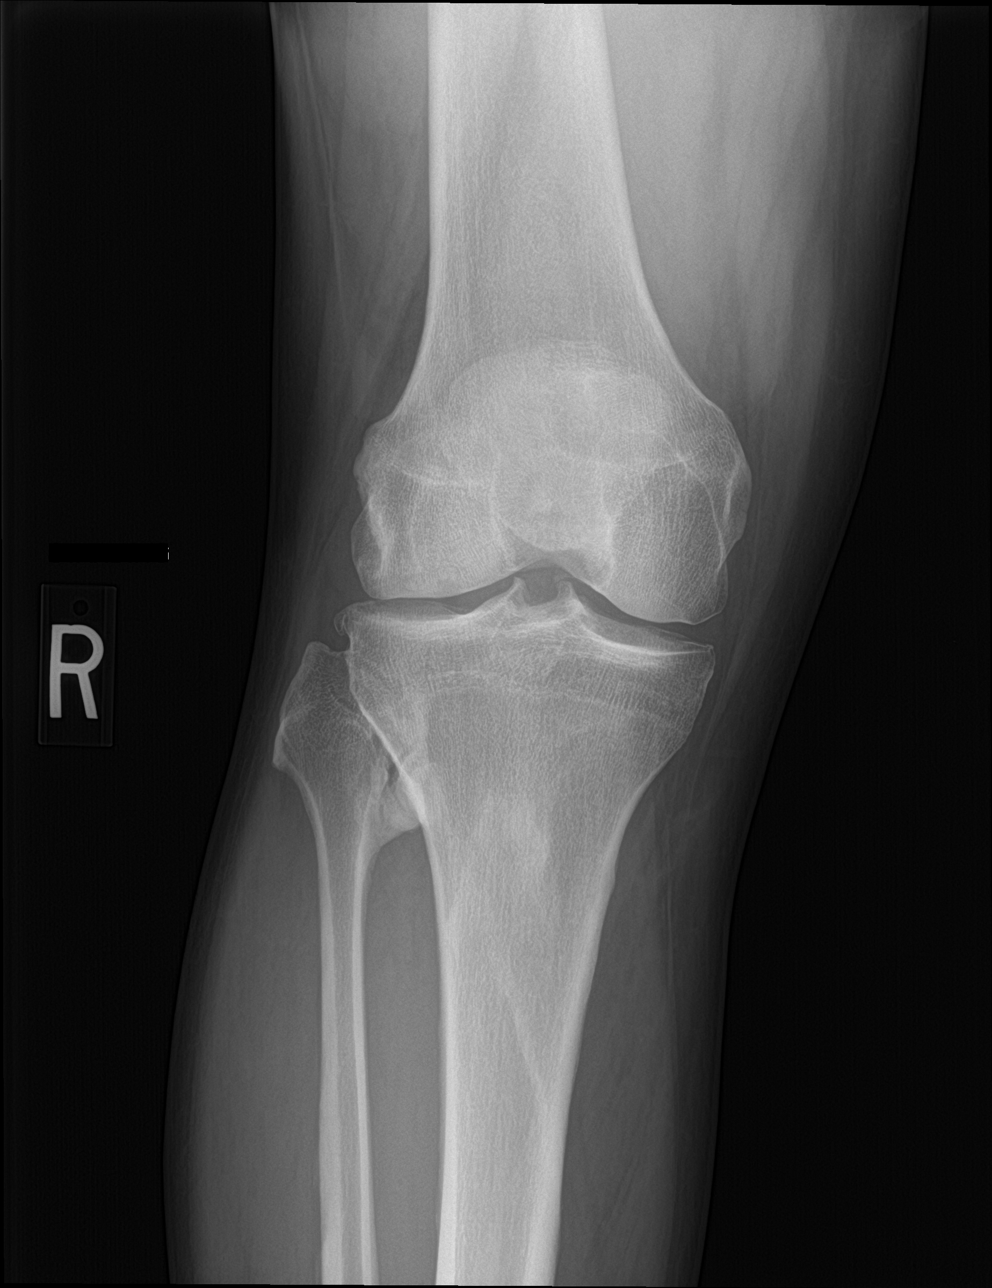

[knee lat]
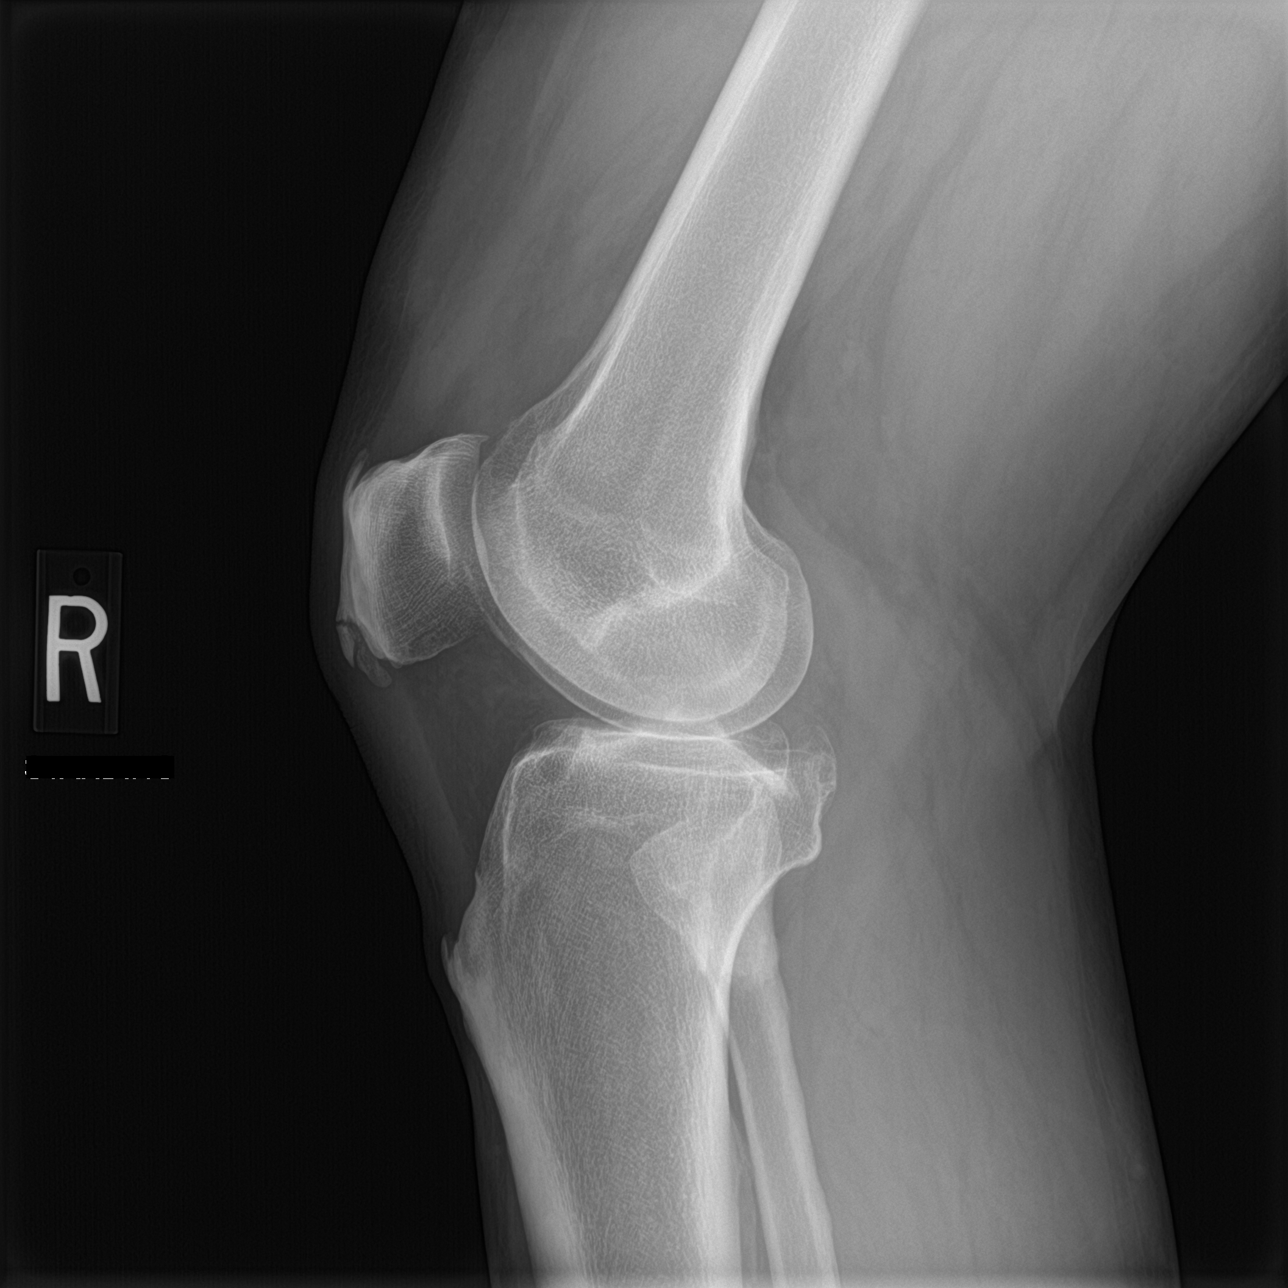

[knee sunrise]
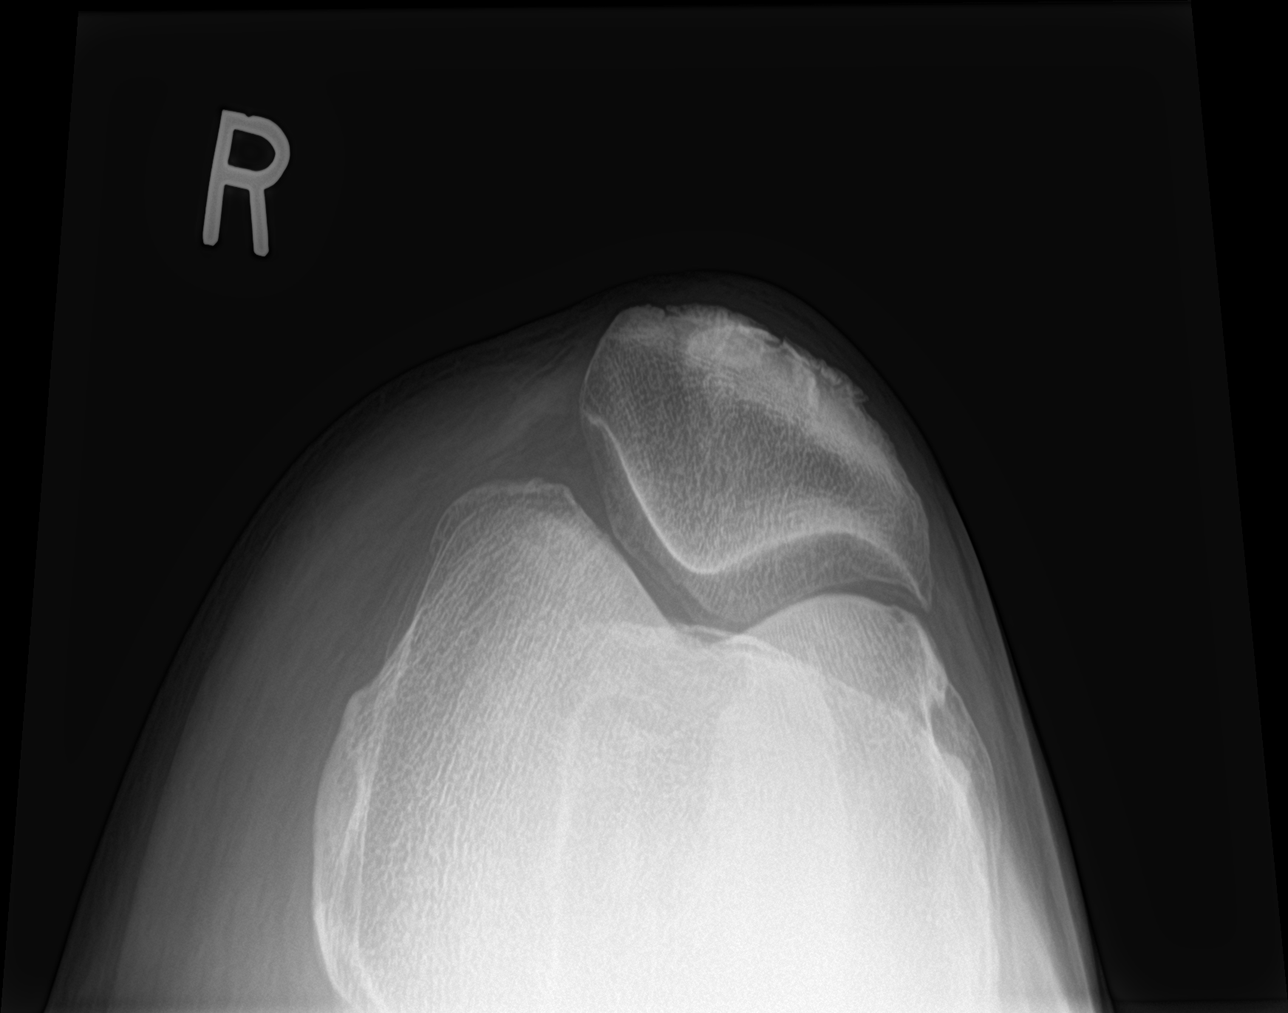

[3 of 3 positions shown; findings below may reference images not displayed]

FINDINGS: Standing AP and lateral as well as patellar sunrise views of the
right knee. Normal alignment and joint spaces. There is a deep
condylar notch. Peripheral tricompartmental spurring,
mild-to-moderate and most prominent in the lateral tibiofemoral
compartment. Mild spurring of the tibial spines. Quadriceps and
patellar tendon enthesophytes. Osseous bridging of the proximal
fibula and tibia may represent sequela of remote injury. Minimal
joint effusion.
IMPRESSION: 1. Mild osteoarthritis. Small joint effusion.
2. Osseous bridging of the proximal fibula and tibia may represent
sequela of prior injury.

## 2022-08-17 ENCOUNTER — Encounter: Payer: Self-pay | Admitting: *Deleted

## 2022-08-17 ENCOUNTER — Ambulatory Visit
Admission: EM | Admit: 2022-08-17 | Discharge: 2022-08-17 | Disposition: A | Discharge status: Home / Self Care | Payer: Commercial Managed Care - HMO | Attending: Internal Medicine | Admitting: Internal Medicine

## 2022-08-17 DIAGNOSIS — J069 Acute upper respiratory infection, unspecified: Secondary | ICD-10-CM | POA: Diagnosis not present

## 2022-08-17 DIAGNOSIS — R053 Chronic cough: Secondary | ICD-10-CM | POA: Diagnosis not present

## 2022-08-17 HISTORY — DX: Plantar fascial fibromatosis: M72.2

## 2022-08-17 MED ORDER — AMOXICILLIN-POT CLAVULANATE 875-125 MG PO TABS: 1.0000 | ORAL_TABLET | Freq: Two times a day (BID) | ORAL | 0 refills | Status: DC

## 2022-08-17 MED ORDER — PREDNISONE 20 MG PO TABS: 40.0000 mg | ORAL_TABLET | Freq: Every day | ORAL | 0 refills | Status: AC

## 2022-08-17 MED ORDER — BENZONATATE 100 MG PO CAPS: 100.0000 mg | ORAL_CAPSULE | Freq: Three times a day (TID) | ORAL | 0 refills | Status: DC | PRN

## 2022-08-17 MED ORDER — ALBUTEROL SULFATE HFA 108 (90 BASE) MCG/ACT IN AERS: 1.0000 | INHALATION_SPRAY | Freq: Four times a day (QID) | RESPIRATORY_TRACT | 0 refills | Status: DC | PRN

## 2022-08-17 NOTE — ED Triage Notes (Addendum)
C/O chest congestion and mostly dry cough with occasional sputum onset approx 1 wk ago. Denies fevers. Has been taking Sudafed, Mucinex, fermented garlic & honey mix. Reports negative home Covid test 3 days ago.

## 2022-08-17 NOTE — ED Provider Notes (Signed)
[facility]    CSN: [id number] Arrival date & time: 08/17/22  1620      History   Chief Complaint Chief Complaint  Patient presents with   Cough    HPI Mitchell Barry is a 56 y.o. male.   Patient presents with persistent cough that has been present for about 1.5 to 2 weeks.  Patient reports that he also has some nasal congestion that is present.  Denies chest pain or shortness of breath but does endorse some "chest tightness".  Denies any formal diagnosis of asthma or COPD.  Has been taking Sudafed and Mucinex with minimal improvement in symptoms.  Denies any known sick contacts or fevers.  Patient has taken 3 COVID tests at home that were negative.  Denies sore throat, ear pain, nausea, vomiting, diarrhea, abdominal pain.   Cough   Past Medical History:  Diagnosis Date   Nephrolithiasis    Plantar fasciitis     Patient Active Problem List   Diagnosis Date Noted   Chest pain 08/24/2011    Past Surgical History:  Procedure Laterality Date   NO PAST SURGERIES         Home Medications    Prior to Admission medications   Medication Sig Start Date End Date Taking? Authorizing Provider  albuterol (VENTOLIN HFA) 108 (90 Base) MCG/ACT inhaler Inhale 1-2 puffs into the lungs every 6 (six) hours as needed for wheezing or shortness of breath. 08/17/22  Yes Mindee Robledo, Rolly Salter E, FNP  amoxicillin-clavulanate (AUGMENTIN) 875-125 MG tablet Take 1 tablet by mouth every 12 (twelve) hours. 08/17/22  Yes Gwendalynn Eckstrom, Rolly Salter E, FNP  benzonatate (TESSALON) 100 MG capsule Take 1 capsule (100 mg total) by mouth every 8 (eight) hours as needed for cough. 08/17/22  Yes Mariabella Nilsen, Rolly Salter E, FNP  predniSONE (DELTASONE) 20 MG tablet Take 2 tablets (40 mg total) by mouth daily for 5 days. 08/17/22 08/22/22 Yes Emmogene Simson, Acie Fredrickson, FNP    Family History Family History  Problem Relation Age of Onset   Diabetes Father    Heart disease Father     Social History Social History   Tobacco Use    Smoking status: Never   Smokeless tobacco: Never  Vaping Use   Vaping Use: Never used  Substance Use Topics   Alcohol use: Yes    Alcohol/week: 6.0 standard drinks of alcohol    Types: 6 Standard drinks or equivalent per week   Drug use: Never     Allergies   Patient has no known allergies.   Review of Systems Review of Systems Per HPI  Physical Exam Triage Vital Signs ED Triage Vitals  Enc Vitals Group     BP 08/17/22 1639 134/84     Pulse Rate 08/17/22 1639 74     Resp 08/17/22 1639 20     Temp 08/17/22 1639 98 F (36.7 C)     Temp Source 08/17/22 1639 Oral     SpO2 08/17/22 1639 95 %     Weight --      Height --      Head Circumference --      Peak Flow --      Pain Score 08/17/22 1641 0     Pain Loc --      Pain Edu? --      Excl. in GC? --    No data found.  Updated Vital Signs BP 134/84   Pulse 74   Temp 98 F (36.7 C) (Oral)   Resp  20   SpO2 95%   Visual Acuity Right Eye Distance:   Left Eye Distance:   Bilateral Distance:    Right Eye Near:   Left Eye Near:    Bilateral Near:     Physical Exam Constitutional:      General: He is not in acute distress.    Appearance: Normal appearance. He is not toxic-appearing or diaphoretic.  HENT:     Head: Normocephalic and atraumatic.     Right Ear: Tympanic membrane and ear canal normal.     Left Ear: Tympanic membrane and ear canal normal.     Nose: Congestion present.     Mouth/Throat:     Mouth: Mucous membranes are moist.     Pharynx: No posterior oropharyngeal erythema.  Eyes:     Extraocular Movements: Extraocular movements intact.     Conjunctiva/sclera: Conjunctivae normal.     Pupils: Pupils are equal, round, and reactive to light.  Cardiovascular:     Rate and Rhythm: Normal rate and regular rhythm.     Pulses: Normal pulses.     Heart sounds: Normal heart sounds.  Pulmonary:     Effort: Pulmonary effort is normal. No respiratory distress.     Breath sounds: Normal breath sounds.  No stridor. No wheezing, rhonchi or rales.  Abdominal:     General: Abdomen is flat. Bowel sounds are normal.     Palpations: Abdomen is soft.  Musculoskeletal:        General: Normal range of motion.     Cervical back: Normal range of motion.  Skin:    General: Skin is warm and dry.  Neurological:     General: No focal deficit present.     Mental Status: He is alert and oriented to person, place, and time. Mental status is at baseline.  Psychiatric:        Mood and Affect: Mood normal.        Behavior: Behavior normal.      UC Treatments / Results  Labs (all labs ordered are listed, but only abnormal results are displayed) Labs Reviewed - No data to display  EKG   Radiology No results found.  Procedures Procedures (including critical care time)  Medications Ordered in UC Medications - No data to display  Initial Impression / Assessment and Plan / UC Course  I have reviewed the triage vital signs and the nursing notes.  Pertinent labs & imaging results that were available during my care of the patient were reviewed by me and considered in my medical decision making (see chart for details).     Given persistent upper respiratory symptoms over 10 days, will opt to treat with antibiotic for secondary bacterial infection concern.  Patient had antibiotic approximately 1 month ago for dental infection, so will treat with Augmentin.  Patient also has persistent cough but no adventitious lung sounds on exam.  This is concerning for possible bronchitis.  Will treat with albuterol inhaler and prednisone.  Patient has taken these medications in the past and tolerated well.  There are no obvious contraindications to these medications on exam and patient does not take any daily medications.  No adventitious lung sounds on exam so do not think that chest imaging is necessary.  Advised patient of supportive care and symptom management as well.  Discussed return precautions.  Patient  verbalized understanding and was agreeable with plan. Final Clinical Impressions(s) / UC Diagnoses   Final diagnoses:  Acute upper respiratory infection  Persistent  cough     Discharge Instructions      I have prescribed you four different medications for persistent upper respiratory infection and cough.  Please follow-up if symptoms persist or worsen.  Please take these medications with food.    ED Prescriptions     Medication Sig Dispense Auth. Provider   amoxicillin-clavulanate (AUGMENTIN) 875-125 MG tablet Take 1 tablet by mouth every 12 (twelve) hours. 14 tablet [place], [place] E, [place]   predniSONE (DELTASONE) 20 MG tablet Take 2 tablets (40 mg total) by mouth daily for 5 days. 10 tablet [place], Hildred Alamin E, [place]   albuterol (VENTOLIN HFA) 108 (90 Base) MCG/ACT inhaler Inhale 1-2 puffs into the lungs every 6 (six) hours as needed for wheezing or shortness of breath. 1 each [place], [place] E, [place]   benzonatate (TESSALON) 100 MG capsule Take 1 capsule (100 mg total) by mouth every 8 (eight) hours as needed for cough. 21 capsule [place], Michele Rockers, [place]      PDMP not reviewed this encounter.   Teodora Medici, [place] 08/17/22 1725

## 2022-08-17 NOTE — Discharge Instructions (Signed)
I have prescribed you four different medications for persistent upper respiratory infection and cough.  Please follow-up if symptoms persist or worsen.  Please take these medications with food.

## 2022-09-11 NOTE — Progress Notes (Unsigned)
    Aleen Sells D.[name] [address] [place] [address] Phone: [phone]   Assessment and Plan:     There are no diagnoses linked to this encounter.  ***   Pertinent previous records reviewed include ***   Follow Up: ***     Subjective:   I, Shandell Jallow, am serving as a Neurosurgeon for [facility] [employer]  Chief Complaint: bilateral ankle , heel and knee pain   HPI:   09/14/2022 Patient is a 56 year old male complaining of bilateral ankle, heel and knee pain. Patient states  Relevant Historical Information: ***  Additional pertinent review of systems negative.   Current Outpatient Medications:    albuterol (VENTOLIN HFA) 108 (90 Base) MCG/ACT inhaler, Inhale 1-2 puffs into the lungs every 6 (six) hours as needed for wheezing or shortness of breath., Disp: 1 each, Rfl: 0   amoxicillin-clavulanate (AUGMENTIN) 875-125 MG tablet, Take 1 tablet by mouth every 12 (twelve) hours., Disp: 14 tablet, Rfl: 0   benzonatate (TESSALON) 100 MG capsule, Take 1 capsule (100 mg total) by mouth every 8 (eight) hours as needed for cough., Disp: 21 capsule, Rfl: 0   Objective:     There were no vitals filed for this visit.    There is no height or weight on file to calculate BMI.    Physical Exam:    ***   Electronically signed by:  Aleen Sells D.[name] 11:38 AM 09/11/22

## 2022-09-14 ENCOUNTER — Ambulatory Visit: Payer: Commercial Managed Care - HMO | Admitting: Sports Medicine

## 2022-09-14 VITALS — BP 132/82 | HR 66 | Ht 74.0 in | Wt 248.0 lb

## 2022-09-14 DIAGNOSIS — M7662 Achilles tendinitis, left leg: Secondary | ICD-10-CM

## 2022-09-14 DIAGNOSIS — M7661 Achilles tendinitis, right leg: Secondary | ICD-10-CM

## 2022-09-14 DIAGNOSIS — G8929 Other chronic pain: Secondary | ICD-10-CM

## 2022-09-14 DIAGNOSIS — M25561 Pain in right knee: Secondary | ICD-10-CM | POA: Diagnosis not present

## 2022-09-14 MED ORDER — MELOXICAM 15 MG PO TABS: 15.0000 mg | ORAL_TABLET | Freq: Every day | ORAL | 0 refills | Status: AC

## 2022-09-14 NOTE — Patient Instructions (Addendum)
Good to see you  - Start meloxicam 15 mg daily x2 weeks.  If still having pain after 2 weeks, complete 3rd-week of meloxicam. May use remaining meloxicam as needed once daily for pain control.  Do not to use additional NSAIDs while taking meloxicam.  May use Tylenol [phone] mg 2 to 3 times a day for breakthrough pain. Lower leg HEP Recommend using shoes with wide toe box ie Altra  Wear inserts with physical activity 3-4 week follow up
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
## 2022-10-06 ENCOUNTER — Ambulatory Visit: Payer: Commercial Managed Care - HMO | Admitting: Sports Medicine

## 2022-10-06 VITALS — HR 67 | Ht 74.0 in | Wt 246.0 lb

## 2022-10-06 DIAGNOSIS — M25561 Pain in right knee: Secondary | ICD-10-CM | POA: Diagnosis not present

## 2022-10-06 DIAGNOSIS — G8929 Other chronic pain: Secondary | ICD-10-CM

## 2022-10-06 DIAGNOSIS — M7662 Achilles tendinitis, left leg: Secondary | ICD-10-CM

## 2022-10-06 DIAGNOSIS — M7661 Achilles tendinitis, right leg: Secondary | ICD-10-CM | POA: Diagnosis not present

## 2022-10-06 NOTE — Patient Instructions (Signed)
Good to see you   

## 2022-10-11 ENCOUNTER — Other Ambulatory Visit: Payer: Self-pay | Admitting: Sports Medicine

## 2022-11-02 ENCOUNTER — Emergency Department (HOSPITAL_BASED_OUTPATIENT_CLINIC_OR_DEPARTMENT_OTHER)
Admission: EM | Admit: 2022-11-02 | Discharge: 2022-11-02 | Disposition: A | Discharge status: Home / Self Care | Payer: BLUE CROSS/BLUE SHIELD | Attending: Emergency Medicine | Admitting: Emergency Medicine

## 2022-11-02 ENCOUNTER — Encounter (HOSPITAL_BASED_OUTPATIENT_CLINIC_OR_DEPARTMENT_OTHER): Payer: Self-pay | Admitting: *Deleted

## 2022-11-02 ENCOUNTER — Other Ambulatory Visit: Payer: Self-pay

## 2022-11-02 DIAGNOSIS — R059 Cough, unspecified: Secondary | ICD-10-CM | POA: Diagnosis present

## 2022-11-02 DIAGNOSIS — U071 COVID-19: Secondary | ICD-10-CM

## 2022-11-02 DIAGNOSIS — M25511 Pain in right shoulder: Secondary | ICD-10-CM | POA: Diagnosis not present

## 2022-11-02 LAB — RESP PANEL BY RT-PCR (RSV, FLU A&B, COVID)  RVPGX2
Influenza A by PCR: NEGATIVE
Influenza B by PCR: NEGATIVE
Resp Syncytial Virus by PCR: NEGATIVE
SARS Coronavirus 2 by RT PCR: POSITIVE — AB

## 2022-11-02 LAB — BASIC METABOLIC PANEL
Anion gap: 11 (ref 5–15)
BUN: 10 mg/dL (ref 6–20)
CO2: 24 mmol/L (ref 22–32)
Calcium: 9.3 mg/dL (ref 8.9–10.3)
Chloride: 102 mmol/L (ref 98–111)
Creatinine, Ser: 1.01 mg/dL (ref 0.61–1.24)
GFR, Estimated: >60 mL/min (ref >60)
Glucose, Bld: 126 mg/dL — ABNORMAL HIGH (ref 70–99)
Potassium: 4.2 mmol/L (ref 3.5–5.1)
Sodium: 137 mmol/L (ref 135–145)

## 2022-11-02 MED ORDER — NIRMATRELVIR/RITONAVIR (PAXLOVID)TABLET: 3.0000 | ORAL_TABLET | Freq: Two times a day (BID) | ORAL | 0 refills | Status: AC

## 2022-11-02 NOTE — ED Notes (Signed)
Discharge paperwork given and verbally understood. 

## 2022-11-02 NOTE — ED Triage Notes (Signed)
Pt is here as he had a positive covid test at home yesterday.  No sob.  Pt has had URI x3 weeks.

## 2022-11-02 NOTE — Discharge Instructions (Signed)
You were diagnosed with COVID-19.  I have prescribed Paxlovid which is an antiviral medicine.  Please take over-the-counter medication such as acetaminophen or ibuprofen as needed for fever and pain control. Please follow-up with sports medicine for further evaluation of your right shoulder pain.  As discussed, rest would be the first-line of treatment for this pain. If you develop any life-threatening symptoms please return to the emergency department

## 2022-11-02 NOTE — ED Provider Notes (Signed)
[facility] DEPT Provider Note   CSN: [id number] Arrival date & time: 11/02/22  1310     History  Chief Complaint  Patient presents with   [place] is a 57 y.o. male.  Patient presents the emergency department complaining of cough and bodyaches which began yesterday.  Patient states he was recently treated for a sinus infection with amoxicillin and still has some congestion from that previous infection which was approximately 3 weeks ago.  He denies fevers, chest pain, shortness of breath, nausea, vomiting.  Past medical history significant for nephrolithiasis  HPI     Home Medications Prior to Admission medications   Medication Sig Start Date End Date Taking? Authorizing Provider  nirmatrelvir/ritonavir (PAXLOVID) 20 x 150 MG & 10 x 100MG  TABS Take 3 tablets by mouth 2 (two) times daily for 5 days. Patient GFR is >60. Take nirmatrelvir (150 mg) two tablets twice daily for 5 days and ritonavir (100 mg) one tablet twice daily for 5 days. 11/02/22 11/07/22 Yes Dorothyann Peng, PA-C  meloxicam (MOBIC) 15 MG tablet Take 1 tablet (15 mg total) by mouth daily. 09/14/22   Glennon Mac, DO      Allergies    Patient has no known allergies.    Review of Systems   Review of Systems  Constitutional:  Negative for fever.  HENT:  Positive for congestion.   Respiratory:  Positive for cough.   Musculoskeletal:  Positive for myalgias.    Physical Exam Updated Vital Signs BP (!) 127/94 (BP Location: Right Arm)   Pulse 68   Temp 98.3 F (36.8 C)   Resp 16   SpO2 97%  Physical Exam Vitals and nursing note reviewed.  Constitutional:      General: He is not in acute distress.    Appearance: He is well-developed.  HENT:     Head: Normocephalic and atraumatic.  Eyes:     Conjunctiva/sclera: Conjunctivae normal.  Cardiovascular:     Rate and Rhythm: Normal rate and regular rhythm.     Heart sounds: No murmur heard. Pulmonary:      Effort: Pulmonary effort is normal. No respiratory distress.     Breath sounds: Normal breath sounds.  Abdominal:     Palpations: Abdomen is soft.     Tenderness: There is no abdominal tenderness.  Musculoskeletal:        General: No swelling.     Cervical back: Neck supple.  Skin:    General: Skin is warm and dry.     Capillary Refill: Capillary refill takes less than 2 seconds.  Neurological:     Mental Status: He is alert.  Psychiatric:        Mood and Affect: Mood normal.     ED Results / Procedures / Treatments   Labs (all labs ordered are listed, but only abnormal results are displayed) Labs Reviewed  RESP PANEL BY RT-PCR (RSV, FLU A&B, COVID)  RVPGX2 - Abnormal; Notable for the following components:      Result Value   SARS Coronavirus 2 by RT PCR POSITIVE (*)    All other components within normal limits  BASIC METABOLIC PANEL - Abnormal; Notable for the following components:   Glucose, Bld 126 (*)    All other components within normal limits    EKG None  Radiology No results found.  Procedures Procedures    Medications Ordered in ED Medications - No data to display  ED Course/ Medical Decision  Making/ A&P                           Medical Decision Making Amount and/or Complexity of Data Reviewed Labs: ordered.   Patient presents with a chief complaint of cough, body aches, and congestion.  Differential diagnosis includes but is not limited to COVID-19, influenza, RSV, and other infections along with other processes  I reviewed the patient's past medical history which included a visit in October with a diagnosis of an upper respiratory infection with the patient was prescribed Augmentin  I ordered and reviewed labs.  Pertinent results include positive COVID-19 test result, GFR greater than 60  The patient is COVID-positive which is consistent with the symptoms he is complaining of.  I discussed antiviral therapy and the patient would like a  prescription for Paxlovid.  Prescription sent to pharmacy.  The patient also complains of right shoulder pain with no new injury or trauma.  He has pain with external rotation and with abduction of the right shoulder.  Patient is concerned about possible rotator cuff injury.  Plain films would not be beneficial in this instance.  Plan to have patient follow-up with sports medicine as he has seen them recently for acute knee pain.        Final Clinical Impression(s) / ED Diagnoses Final diagnoses:  COVID-19  Acute pain of right shoulder    Rx / DC Orders ED Discharge Orders          Ordered    nirmatrelvir/ritonavir (PAXLOVID) 20 x 150 MG & 10 x 100MG  TABS  2 times daily        11/02/22 1534              Ronny Bacon 11/02/22 1558    Tretha Sciara, MD 11/02/22 2028

## 2023-06-23 ENCOUNTER — Ambulatory Visit (INDEPENDENT_AMBULATORY_CARE_PROVIDER_SITE_OTHER): Payer: BLUE CROSS/BLUE SHIELD | Admitting: Podiatry

## 2023-06-23 ENCOUNTER — Encounter: Payer: Self-pay | Admitting: Podiatry

## 2023-06-23 ENCOUNTER — Ambulatory Visit (INDEPENDENT_AMBULATORY_CARE_PROVIDER_SITE_OTHER): Payer: BLUE CROSS/BLUE SHIELD

## 2023-06-23 DIAGNOSIS — M79672 Pain in left foot: Secondary | ICD-10-CM

## 2023-06-23 DIAGNOSIS — M7661 Achilles tendinitis, right leg: Secondary | ICD-10-CM | POA: Diagnosis not present

## 2023-06-23 DIAGNOSIS — M79671 Pain in right foot: Secondary | ICD-10-CM

## 2023-06-23 DIAGNOSIS — M7662 Achilles tendinitis, left leg: Secondary | ICD-10-CM

## 2023-06-23 MED ORDER — DICLOFENAC SODIUM 75 MG PO TBEC: 75.0000 mg | DELAYED_RELEASE_TABLET | Freq: Two times a day (BID) | ORAL | 2 refills | Status: DC

## 2023-06-23 MED ORDER — TRIAMCINOLONE ACETONIDE 10 MG/ML IJ SUSP
10.0000 mg | Freq: Once | INTRAMUSCULAR | Status: AC
  Administered 2023-06-23: 10 mg via INTRA_ARTICULAR

## 2023-06-23 NOTE — Patient Instructions (Signed)

## 2023-06-23 NOTE — Progress Notes (Signed)
Subjective:   Patient ID: Mitchell Barry, male   DOB: 57 y.o.   MRN: [id number]   HPI Patient presents with a lot of pain in the posterior heel region left over right which has been going on for about 6 months to a year but worse over the last few months.  He is very active and does a lot of different activities and states it is very sore when he is trying to be active.  Patient does not smoke likes to be active   Review of Systems  All other systems reviewed and are negative.       Objective:  Physical Exam Vitals and nursing note reviewed.  Constitutional:      Appearance: He is well-developed.  Pulmonary:     Effort: Pulmonary effort is normal.  Musculoskeletal:        General: Normal range of motion.  Skin:    General: Skin is warm.  Neurological:     Mental Status: He is alert.     Neurovascular status intact muscle strength was found to be adequate range of motion adequate exquisite discomfort posterior aspect heel insertion left over right medial side inflammation fluid no central or lateral involvement currently.  Left much worse than right currently with patient found to have good digital perfusion well-oriented     Assessment:  Appears to be acute Achilles tendinitis left over right medial side at insertion with possibility for bone spur     Plan:  H&P condition reviewed.  At this point I have recommended careful injection I explained procedure risk and especially risk of rupture.  He is willing to accept this risk and only get a work on the medial side and I did sterile prep carefully inject the medial side 3 mg Dexasone Kenalog 5 mg Xylocaine staying away from the central lateral.  Discussed heel spur structure and applied heel lifts with possibility for more aggressive treatment in future  X-rays indicate large posterior spur heel region bilateral

## 2023-07-21 ENCOUNTER — Ambulatory Visit: Payer: BLUE CROSS/BLUE SHIELD | Admitting: Podiatry

## 2023-07-23 ENCOUNTER — Encounter: Payer: Self-pay | Admitting: Podiatry

## 2023-07-23 ENCOUNTER — Ambulatory Visit: Payer: BLUE CROSS/BLUE SHIELD | Admitting: Podiatry

## 2023-07-23 VITALS — BP 138/82 | HR 57 | Temp 97.2°F | Resp 16 | Ht 74.0 in | Wt 246.0 lb

## 2023-07-23 DIAGNOSIS — M7661 Achilles tendinitis, right leg: Secondary | ICD-10-CM | POA: Diagnosis not present

## 2023-07-23 DIAGNOSIS — M7662 Achilles tendinitis, left leg: Secondary | ICD-10-CM | POA: Diagnosis not present

## 2023-07-23 MED ORDER — TRIAMCINOLONE ACETONIDE 10 MG/ML IJ SUSP
10.0000 mg | Freq: Once | INTRAMUSCULAR | Status: AC
  Administered 2023-07-23: 10 mg via INTRA_ARTICULAR

## 2023-07-25 NOTE — Progress Notes (Signed)
Subjective:   Patient ID: Mitchell Barry, male   DOB: 57 y.o.   MRN: [id number]   HPI Patient states the left 1 is improving with previous injection now the right 1 hurts a lot would like the same treatment   ROS      Objective:  Physical Exam  Neuro vascular status intact with discomfort posterior aspect left heel of a minimal nature and quite a bit of pain in the right heel medial side with no central or lateral involvement Achilles     Assessment:  Tendinitis right with well-healing left      Plan:  H&P reviewed were going to carefully treat the right I did explain the risk of doing this and he is fully aware of this from last foot and wants the procedure and I did sterile prep injected just the medial side staying away from the central lateral portion of the tendon and applied sterile dressings

## 2023-09-09 ENCOUNTER — Other Ambulatory Visit: Payer: Self-pay | Admitting: Podiatry

## 2024-01-18 ENCOUNTER — Other Ambulatory Visit: Payer: Self-pay | Admitting: Podiatry

## 2024-04-28 ENCOUNTER — Other Ambulatory Visit: Payer: Self-pay | Admitting: Podiatry

## 2024-08-02 ENCOUNTER — Encounter: Payer: Self-pay | Admitting: Family Medicine

## 2024-08-02 ENCOUNTER — Ambulatory Visit (INDEPENDENT_AMBULATORY_CARE_PROVIDER_SITE_OTHER): Admitting: Family Medicine

## 2024-08-02 VITALS — BP 136/84 | Ht 74.0 in | Wt 240.0 lb

## 2024-08-02 DIAGNOSIS — M79662 Pain in left lower leg: Secondary | ICD-10-CM | POA: Diagnosis not present

## 2024-08-02 DIAGNOSIS — M79661 Pain in right lower leg: Secondary | ICD-10-CM

## 2024-08-02 DIAGNOSIS — M25561 Pain in right knee: Secondary | ICD-10-CM | POA: Diagnosis not present

## 2024-08-02 NOTE — Patient Instructions (Addendum)
 You have a calf/achilles strain Icing for 15 minutes at a time after exercise. Heel lifts either in temporary orthotics or on their own to prevent further strain when up and walking around. Diclofenac  twice a day with food if needed. Start home exercises daily as directed. Follow up with me in 6 weeks.  Your knee exam is reassuring.  The hamstring tendon back here is tightening up on you and locking. Knee sleeve with activity/sports. Stretches and strengthening exercises will help decrease this from happening.

## 2024-08-02 NOTE — Progress Notes (Signed)
 PCP: Patient, No Pcp Per  Subjective:   HPI: Patient is a 58 y.o. male here for bilateral lower leg, right knee pain.  Patient reports he has had bilateral calf pain but worse on the right. He continues to be physically active participating in the [employer]. During a basketball game on August 31 he felt a pull in the right distal calf. While this is improved he still feels like he is limping at times. He has seen podiatry and been given injections for bilateral Achilles tendinitis. These injections helped at the time. The distal calf and Achilles feels sore with massages and sore after exercise. He works IT consultant. He also reports that his right knee has been locking up posteriorly on the lateral aspect. No swelling or any injury though he does have known arthritis  Past Medical History:  Diagnosis Date   Nephrolithiasis    Plantar fasciitis     Current Outpatient Medications on File Prior to Visit  Medication Sig Dispense Refill   diclofenac  (VOLTAREN ) 75 MG EC tablet TAKE 1 TABLET BY MOUTH TWICE A DAY 50 tablet 2   meloxicam  (MOBIC ) 15 MG tablet Take 1 tablet (15 mg total) by mouth daily. (Patient not taking: Reported on 08/02/2024) 30 tablet 0   No current facility-administered medications on file prior to visit.    Past Surgical History:  Procedure Laterality Date   NO PAST SURGERIES      No Known Allergies  BP 136/84   Ht 6' 2 (1.88 m)   Wt 240 lb (108.9 kg)   BMI 30.81 kg/m       No data to display              No data to display              Objective:  Physical Exam:  Gen: NAD, comfortable in exam room  Bilateral lower legs: No gross deformity, swelling, or bruising. Tenderness to palpation mildly at the musculotendinous junctions of gastroc into Achilles.  No palpable defect. Full range of motion ankles with 5 out of 5 strength and no reproduction of pain. Negative Thompson. Neurovasc intact  distally.   Right knee: No gross deformity, ecchymoses, swelling. Mild TTP biceps femoris tendon.  No joint line or other tenderness. FROM with normal strength. Negative ant/post drawers. Negative valgus/varus testing. Negative lachman.  Negative mcmurrays, apleys.  NV intact distally.  Assessment & Plan:  1. Bilateral lower leg pain - more on the right.  Gastroc/achilles strains.  Icing, heel lifts.  Home exercises reviewed.  Diclofenac  twice a day.  Follow up in 6 weeks.  2. Right knee pain - describes and localizes issues with biceps femoris tendon.  Exam otherwise reassuring.  Knee sleeve, home exercises reviewed.  Follow up in 6 weeks.
